# Patient Record
Sex: Female | Born: 1960
Health system: Southern US, Community
[De-identification: ages and names within clinical notes are randomized; demographics above are authoritative.]

## PROBLEM LIST (undated history)

## (undated) DIAGNOSIS — E039 Hypothyroidism, unspecified: Secondary | ICD-10-CM

## (undated) DIAGNOSIS — F329 Major depressive disorder, single episode, unspecified: Secondary | ICD-10-CM

## (undated) DIAGNOSIS — E079 Disorder of thyroid, unspecified: Secondary | ICD-10-CM

## (undated) DIAGNOSIS — M0230 Reiter's disease, unspecified site: Secondary | ICD-10-CM

## (undated) DIAGNOSIS — E559 Vitamin D deficiency, unspecified: Secondary | ICD-10-CM

## (undated) DIAGNOSIS — F32A Depression, unspecified: Secondary | ICD-10-CM

## (undated) DIAGNOSIS — N809 Endometriosis, unspecified: Secondary | ICD-10-CM

## (undated) HISTORY — DX: Endometriosis, unspecified: N80.9

## (undated) HISTORY — DX: Depression, unspecified: F32.A

## (undated) HISTORY — PX: TONSILLECTOMY: SUR1361

## (undated) HISTORY — DX: Vitamin D deficiency, unspecified: E55.9

## (undated) HISTORY — DX: Disorder of thyroid, unspecified: E07.9

## (undated) HISTORY — PX: CHOLECYSTECTOMY: SHX55

## (undated) HISTORY — DX: Major depressive disorder, single episode, unspecified: F32.9

---

## 1997-07-27 ENCOUNTER — Ambulatory Visit (HOSPITAL_COMMUNITY): Admission: RE | Admit: 1997-07-27 | Discharge: 1997-07-27 | Payer: Self-pay | Admitting: Obstetrics & Gynecology

## 1997-12-09 ENCOUNTER — Ambulatory Visit (HOSPITAL_COMMUNITY): Admission: RE | Admit: 1997-12-09 | Discharge: 1997-12-09 | Payer: Self-pay | Admitting: Gastroenterology

## 2000-09-15 ENCOUNTER — Other Ambulatory Visit: Admission: RE | Admit: 2000-09-15 | Discharge: 2000-09-15 | Payer: Self-pay | Admitting: Obstetrics & Gynecology

## 2001-11-04 ENCOUNTER — Other Ambulatory Visit: Admission: RE | Admit: 2001-11-04 | Discharge: 2001-11-04 | Payer: Self-pay | Admitting: Obstetrics & Gynecology

## 2003-02-10 ENCOUNTER — Other Ambulatory Visit: Admission: RE | Admit: 2003-02-10 | Discharge: 2003-02-10 | Payer: Self-pay | Admitting: Obstetrics & Gynecology

## 2003-06-21 ENCOUNTER — Ambulatory Visit (HOSPITAL_COMMUNITY): Admission: RE | Admit: 2003-06-21 | Discharge: 2003-06-21 | Payer: Self-pay | Admitting: Gastroenterology

## 2003-10-28 ENCOUNTER — Emergency Department (HOSPITAL_COMMUNITY): Admission: EM | Admit: 2003-10-28 | Discharge: 2003-10-29 | Payer: Self-pay | Admitting: Emergency Medicine

## 2004-10-08 ENCOUNTER — Other Ambulatory Visit: Admission: RE | Admit: 2004-10-08 | Discharge: 2004-10-08 | Payer: Self-pay | Admitting: Obstetrics & Gynecology

## 2010-08-11 ENCOUNTER — Emergency Department (INDEPENDENT_AMBULATORY_CARE_PROVIDER_SITE_OTHER): Payer: 59

## 2010-08-11 ENCOUNTER — Emergency Department (HOSPITAL_BASED_OUTPATIENT_CLINIC_OR_DEPARTMENT_OTHER)
Admission: EM | Admit: 2010-08-11 | Discharge: 2010-08-11 | Disposition: A | Discharge status: Home / Self Care | Payer: 59 | Attending: Emergency Medicine | Admitting: Emergency Medicine

## 2010-08-11 DIAGNOSIS — Z79899 Other long term (current) drug therapy: Secondary | ICD-10-CM | POA: Insufficient documentation

## 2010-08-11 DIAGNOSIS — S92919A Unspecified fracture of unspecified toe(s), initial encounter for closed fracture: Secondary | ICD-10-CM

## 2010-08-11 DIAGNOSIS — W2209XA Striking against other stationary object, initial encounter: Secondary | ICD-10-CM

## 2010-08-11 DIAGNOSIS — Z8739 Personal history of other diseases of the musculoskeletal system and connective tissue: Secondary | ICD-10-CM | POA: Insufficient documentation

## 2013-08-05 ENCOUNTER — Other Ambulatory Visit: Payer: Self-pay | Admitting: Family Medicine

## 2013-08-05 DIAGNOSIS — R519 Headache, unspecified: Secondary | ICD-10-CM

## 2013-08-05 DIAGNOSIS — R51 Headache: Principal | ICD-10-CM

## 2013-08-13 ENCOUNTER — Ambulatory Visit
Admission: RE | Admit: 2013-08-13 | Discharge: 2013-08-13 | Disposition: A | Discharge status: Home / Self Care | Payer: 59 | Source: Ambulatory Visit | Attending: Family Medicine | Admitting: Family Medicine

## 2013-08-13 DIAGNOSIS — R519 Headache, unspecified: Secondary | ICD-10-CM

## 2013-08-13 DIAGNOSIS — R51 Headache: Principal | ICD-10-CM

## 2014-11-10 ENCOUNTER — Other Ambulatory Visit: Payer: Self-pay | Admitting: Obstetrics & Gynecology

## 2014-11-10 DIAGNOSIS — R928 Other abnormal and inconclusive findings on diagnostic imaging of breast: Secondary | ICD-10-CM

## 2014-11-15 ENCOUNTER — Ambulatory Visit
Admission: RE | Admit: 2014-11-15 | Discharge: 2014-11-15 | Disposition: A | Discharge status: Home / Self Care | Payer: 59 | Source: Ambulatory Visit | Attending: Obstetrics & Gynecology | Admitting: Obstetrics & Gynecology

## 2014-11-15 DIAGNOSIS — R928 Other abnormal and inconclusive findings on diagnostic imaging of breast: Secondary | ICD-10-CM

## 2016-01-19 ENCOUNTER — Other Ambulatory Visit: Payer: Self-pay | Admitting: Obstetrics & Gynecology

## 2016-01-19 DIAGNOSIS — R928 Other abnormal and inconclusive findings on diagnostic imaging of breast: Secondary | ICD-10-CM

## 2016-01-26 ENCOUNTER — Other Ambulatory Visit: Payer: 59

## 2016-01-30 ENCOUNTER — Ambulatory Visit
Admission: RE | Admit: 2016-01-30 | Discharge: 2016-01-30 | Disposition: A | Discharge status: Home / Self Care | Payer: 59 | Source: Ambulatory Visit | Attending: Obstetrics & Gynecology | Admitting: Obstetrics & Gynecology

## 2016-01-30 DIAGNOSIS — R928 Other abnormal and inconclusive findings on diagnostic imaging of breast: Secondary | ICD-10-CM

## 2016-04-02 DIAGNOSIS — M7062 Trochanteric bursitis, left hip: Secondary | ICD-10-CM | POA: Diagnosis not present

## 2016-04-02 DIAGNOSIS — M5432 Sciatica, left side: Secondary | ICD-10-CM | POA: Diagnosis not present

## 2016-04-03 DIAGNOSIS — M5432 Sciatica, left side: Secondary | ICD-10-CM | POA: Diagnosis not present

## 2016-04-09 DIAGNOSIS — M5432 Sciatica, left side: Secondary | ICD-10-CM | POA: Diagnosis not present

## 2016-04-24 DIAGNOSIS — M5432 Sciatica, left side: Secondary | ICD-10-CM | POA: Diagnosis not present

## 2016-05-04 DIAGNOSIS — R319 Hematuria, unspecified: Secondary | ICD-10-CM | POA: Diagnosis not present

## 2016-05-04 DIAGNOSIS — N390 Urinary tract infection, site not specified: Secondary | ICD-10-CM | POA: Diagnosis not present

## 2016-05-04 DIAGNOSIS — R300 Dysuria: Secondary | ICD-10-CM | POA: Diagnosis not present

## 2016-05-13 DIAGNOSIS — N390 Urinary tract infection, site not specified: Secondary | ICD-10-CM | POA: Diagnosis not present

## 2016-05-13 DIAGNOSIS — R1310 Dysphagia, unspecified: Secondary | ICD-10-CM | POA: Diagnosis not present

## 2016-05-13 DIAGNOSIS — M5432 Sciatica, left side: Secondary | ICD-10-CM | POA: Diagnosis not present

## 2016-05-27 ENCOUNTER — Other Ambulatory Visit: Payer: Self-pay | Admitting: Gastroenterology

## 2016-05-27 DIAGNOSIS — R1011 Right upper quadrant pain: Secondary | ICD-10-CM | POA: Diagnosis not present

## 2016-05-27 DIAGNOSIS — R1013 Epigastric pain: Secondary | ICD-10-CM

## 2016-06-05 ENCOUNTER — Ambulatory Visit
Admission: RE | Admit: 2016-06-05 | Discharge: 2016-06-05 | Disposition: A | Discharge status: Home / Self Care | Payer: 59 | Source: Ambulatory Visit | Attending: Gastroenterology | Admitting: Gastroenterology

## 2016-06-05 DIAGNOSIS — R1011 Right upper quadrant pain: Secondary | ICD-10-CM

## 2016-06-05 DIAGNOSIS — R109 Unspecified abdominal pain: Secondary | ICD-10-CM | POA: Diagnosis not present

## 2016-06-05 DIAGNOSIS — R1013 Epigastric pain: Secondary | ICD-10-CM

## 2016-06-05 DIAGNOSIS — K449 Diaphragmatic hernia without obstruction or gangrene: Secondary | ICD-10-CM | POA: Diagnosis not present

## 2016-06-26 ENCOUNTER — Other Ambulatory Visit: Payer: Self-pay | Admitting: Gastroenterology

## 2016-06-26 DIAGNOSIS — R1314 Dysphagia, pharyngoesophageal phase: Secondary | ICD-10-CM | POA: Diagnosis not present

## 2016-06-26 DIAGNOSIS — R1011 Right upper quadrant pain: Secondary | ICD-10-CM | POA: Diagnosis not present

## 2016-07-03 ENCOUNTER — Ambulatory Visit
Admission: RE | Admit: 2016-07-03 | Discharge: 2016-07-03 | Disposition: A | Discharge status: Home / Self Care | Payer: 59 | Source: Ambulatory Visit | Attending: Gastroenterology | Admitting: Gastroenterology

## 2016-07-03 DIAGNOSIS — R1011 Right upper quadrant pain: Secondary | ICD-10-CM

## 2016-07-03 MED ORDER — IOPAMIDOL (ISOVUE-300) INJECTION 61%
100.0000 mL | Freq: Once | INTRAVENOUS | Status: AC | PRN
  Administered 2016-07-03: 100 mL via INTRAVENOUS

## 2016-07-22 DIAGNOSIS — R1310 Dysphagia, unspecified: Secondary | ICD-10-CM | POA: Diagnosis not present

## 2016-07-22 DIAGNOSIS — R1013 Epigastric pain: Secondary | ICD-10-CM | POA: Diagnosis not present

## 2016-07-24 DIAGNOSIS — H209 Unspecified iridocyclitis: Secondary | ICD-10-CM | POA: Diagnosis not present

## 2016-07-24 DIAGNOSIS — M0230 Reiter's disease, unspecified site: Secondary | ICD-10-CM | POA: Diagnosis not present

## 2016-07-24 DIAGNOSIS — M4690 Unspecified inflammatory spondylopathy, site unspecified: Secondary | ICD-10-CM | POA: Diagnosis not present

## 2016-08-12 DIAGNOSIS — E559 Vitamin D deficiency, unspecified: Secondary | ICD-10-CM | POA: Diagnosis not present

## 2016-08-12 DIAGNOSIS — E039 Hypothyroidism, unspecified: Secondary | ICD-10-CM | POA: Diagnosis not present

## 2016-08-12 DIAGNOSIS — Z0000 Encounter for general adult medical examination without abnormal findings: Secondary | ICD-10-CM | POA: Diagnosis not present

## 2017-02-09 DIAGNOSIS — Z23 Encounter for immunization: Secondary | ICD-10-CM | POA: Diagnosis not present

## 2017-03-18 DIAGNOSIS — J029 Acute pharyngitis, unspecified: Secondary | ICD-10-CM | POA: Diagnosis not present

## 2017-03-22 DIAGNOSIS — H1032 Unspecified acute conjunctivitis, left eye: Secondary | ICD-10-CM | POA: Diagnosis not present

## 2017-03-31 ENCOUNTER — Other Ambulatory Visit: Payer: Self-pay

## 2017-03-31 ENCOUNTER — Encounter (HOSPITAL_BASED_OUTPATIENT_CLINIC_OR_DEPARTMENT_OTHER): Payer: Self-pay | Admitting: *Deleted

## 2017-03-31 ENCOUNTER — Emergency Department (HOSPITAL_BASED_OUTPATIENT_CLINIC_OR_DEPARTMENT_OTHER)
Admission: EM | Admit: 2017-03-31 | Discharge: 2017-03-31 | Disposition: A | Discharge status: Home / Self Care | Payer: 59 | Attending: Physician Assistant | Admitting: Physician Assistant

## 2017-03-31 DIAGNOSIS — Z79899 Other long term (current) drug therapy: Secondary | ICD-10-CM | POA: Insufficient documentation

## 2017-03-31 DIAGNOSIS — Z7982 Long term (current) use of aspirin: Secondary | ICD-10-CM | POA: Diagnosis not present

## 2017-03-31 DIAGNOSIS — M0230 Reiter's disease, unspecified site: Secondary | ICD-10-CM | POA: Diagnosis not present

## 2017-03-31 DIAGNOSIS — M25561 Pain in right knee: Secondary | ICD-10-CM | POA: Diagnosis not present

## 2017-03-31 DIAGNOSIS — M5442 Lumbago with sciatica, left side: Secondary | ICD-10-CM

## 2017-03-31 HISTORY — DX: Reiter's disease, unspecified site: M02.30

## 2017-03-31 LAB — URINALYSIS, ROUTINE W REFLEX MICROSCOPIC
Bilirubin Urine: NEGATIVE
Glucose, UA: NEGATIVE mg/dL
Ketones, ur: NEGATIVE mg/dL
LEUKOCYTES UA: NEGATIVE
Nitrite: NEGATIVE
PROTEIN: NEGATIVE mg/dL
Specific Gravity, Urine: >1.03 — ABNORMAL HIGH (ref 1.005–1.030)
pH: 6 (ref 5.0–8.0)

## 2017-03-31 LAB — URINALYSIS, MICROSCOPIC (REFLEX)

## 2017-03-31 MED ORDER — IBUPROFEN 600 MG PO TABS: 600.0000 mg | ORAL_TABLET | Freq: Four times a day (QID) | ORAL | 0 refills | Status: DC | PRN

## 2017-03-31 MED ORDER — LIDOCAINE 5 % EX PTCH: 1.0000 | MEDICATED_PATCH | CUTANEOUS | 0 refills | Status: DC

## 2017-03-31 MED ORDER — HYDROCODONE-ACETAMINOPHEN 5-325 MG PO TABS
1.0000 | ORAL_TABLET | ORAL | 0 refills | Status: DC | PRN
Start: 2017-03-31 — End: 2019-08-29

## 2017-03-31 MED ORDER — METHOCARBAMOL 500 MG PO TABS: 500.0000 mg | ORAL_TABLET | Freq: Two times a day (BID) | ORAL | 0 refills | Status: DC

## 2017-03-31 MED ORDER — PREDNISONE 10 MG (21) PO TBPK: ORAL_TABLET | ORAL | 0 refills | Status: DC

## 2017-03-31 NOTE — ED Provider Notes (Signed)
[facility] EMERGENCY DEPARTMENT Provider Note   CSN: [id number] Arrival date & time: 03/31/17  1601     History   Chief Complaint No chief complaint on file.   HPI Michelle Fletcher is a 56 y.o. female.  HPI   Michelle Fletcher is a 56 y.o. female, with a history of Reiter syndrome and thyroid disease, presenting to the ED with what she believes is a flare of her Reiter's syndrome. States she typically has a triad of conjunctivitis, singular joint pain, and hematuria with her Reiter's syndrome flares.  States is typically treated with a 6-day prednisone taper. Complains of right knee pain, redness, and swelling over the past couple days.    Left eye redness, irritation, and minor discharge last week, subsided, then progressed to the right eye.  The right eye symptoms have also subsided prior to her arrival today.  Patient is followed by rheumatologist, however, she states she has been unable to get in to see her due to the holiday. She adds that the above symptoms were preceded by upper respiratory congestion and cough, both of which have subsided.  Patient also complains of left lower back pain with muscle spasms for the last 3 or 4 days.  States she has had this problem in the past.  Pain radiates down the back of the left leg.  Denies numbness/tingling, weakness, falls/trauma, abdominal pain, urinary complaints, gross hematuria, fever/chills, N/V/D currently, or any other complaints.  Denies history of PE/DVT, recent immobilization, surgery, trauma, procedures to the right knee, history of septic joint, or history of gonorrhea infection.   Past Medical History:  Diagnosis Date  . Depression   . Endometriosis   . Reiter's syndrome (HCC)   . Thyroid disease   . Vitamin D deficiency     There are no active problems to display for this patient.   Past Surgical History:  Procedure Laterality Date  . CESAREAN SECTION     x2  . CHOLECYSTECTOMY    . TONSILLECTOMY       OB History    No data available       Home Medications    Prior to Admission medications   Medication Sig Start Date End Date Taking? Authorizing Provider  aspirin 81 MG tablet Take 81 mg by mouth daily.   Yes [provider]  buPROPion (WELLBUTRIN SR) 150 MG 12 hr tablet Take 150 mg by mouth 3 (three) times daily.   Yes [provider]  drospirenone-ethinyl estradiol (YAZ,GIANVI,LORYNA) 3-0.02 MG tablet Take 1 tablet by mouth daily.   Yes [provider]  Levothyroxine Sodium (SYNTHROID PO) Take by mouth.   Yes [provider]  HYDROcodone-acetaminophen (NORCO/VICODIN) 5-325 MG tablet Take 1-2 tablets by mouth every 4 (four) hours as needed for severe pain. 03/31/17   Joy, Shawn C, PA-C  ibuprofen (ADVIL,MOTRIN) 600 MG tablet Take 1 tablet (600 mg total) by mouth every 6 (six) hours as needed. 03/31/17   Joy, Shawn C, PA-C  lidocaine (LIDODERM) 5 % Place 1 patch onto the skin daily. Remove & Discard patch within 12 hours or as directed by MD 03/31/17   Joy, Shawn C, PA-C  methocarbamol (ROBAXIN) 500 MG tablet Take 1 tablet (500 mg total) by mouth 2 (two) times daily. 03/31/17   Joy, Shawn C, PA-C  predniSONE (STERAPRED UNI-PAK 21 TAB) 10 MG (21) TBPK tablet Take 6 tabs (60mg ) on day 1, 5 tabs (50mg ) on day 2, 4 tabs (40mg ) on day 3,  3 tabs (30mg ) on day 4, 2 tabs (20mg ) on day 5, and 1 tab (10mg ) on day 6. 03/31/17   Joy, Hillard DankerShawn C, PA-C    Family History Family History  Problem Relation Age of Onset  . Hypertension Mother   . Hypertension Father   . Hypertension Brother     Social History Social History   Tobacco Use  . Smoking status: Never Smoker  . Smokeless tobacco: Never Used  Substance Use Topics  . Alcohol use: No    Frequency: Never  . Drug use: No     Allergies   Sulfa antibiotics   Review of Systems Review of Systems  Constitutional: Negative for chills, diaphoresis and fever.  HENT: Negative for trouble swallowing.    Respiratory: Negative for cough and shortness of breath.   Cardiovascular: Negative for chest pain.  Gastrointestinal: Negative for abdominal pain, diarrhea, nausea and vomiting.  Musculoskeletal: Positive for arthralgias, back pain and joint swelling.  Neurological: Negative for dizziness, weakness, light-headedness and numbness.  All other systems reviewed and are negative.    Physical Exam Updated Vital Signs BP 126/63 (BP Location: Right Arm)   Pulse 84   Temp 98.3 F (36.8 C) (Oral)   Resp 16   Ht 5' 5.5" (1.664 m)   Wt 72.6 kg (160 lb)   SpO2 100%   BMI 26.22 kg/m   Physical Exam  Constitutional: She appears well-developed and well-nourished. No distress.  HENT:  Head: Normocephalic and atraumatic.  Right Ear: Tympanic membrane, external ear and ear canal normal.  Left Ear: Tympanic membrane, external ear and ear canal normal.  Nose: Nose normal.  Mouth/Throat: Uvula is midline and oropharynx is clear and moist.  Eyes: Conjunctivae are normal.  Neck: Neck supple.  Cardiovascular: Normal rate, regular rhythm, normal heart sounds and intact distal pulses.  Pulmonary/Chest: Effort normal and breath sounds normal. No respiratory distress.  Abdominal: Soft. There is no tenderness. There is no guarding.  Musculoskeletal: She exhibits edema and tenderness. She exhibits no deformity.  Some tenderness to the anterior right knee accompanied by minor swelling.  No noted erythema.  Full range of motion in the right knee, although painful past 90 degrees flexion. Tenderness to the left lower lumbar musculature inferior to the superior border of the iliac crest, extending into the left buttock.  Lymphadenopathy:    She has no cervical adenopathy.  Neurological: She is alert.  No noted acute sensory deficits. Strength 5/5 with flexion and extension at the bilateral hips, knees, and ankles. Ambulatory without assistance.  Coordination intact.  Skin: Skin is warm and dry. She is  not diaphoretic.  Psychiatric: She has a normal mood and affect. Her behavior is normal.  Nursing note and vitals reviewed.    ED Treatments / Results  Labs (all labs ordered are listed, but only abnormal results are displayed) Labs Reviewed  URINALYSIS, ROUTINE W REFLEX MICROSCOPIC - Abnormal; Notable for the following components:      Result Value   Specific Gravity, Urine >1.030 (*)    Hgb urine dipstick SMALL (*)    All other components within normal limits  URINALYSIS, MICROSCOPIC (REFLEX) - Abnormal; Notable for the following components:   Bacteria, UA RARE (*)    Squamous Epithelial / LPF 0-5 (*)    All other components within normal limits    EKG  EKG Interpretation None       Radiology No results found.  Procedures Procedures (including critical care time)  Medications Ordered in  ED Medications - No data to display   Initial Impression / Assessment and Plan / ED Course  I have reviewed the triage vital signs and the nursing notes.  Pertinent labs & imaging results that were available during my care of the patient were reviewed by me and considered in my medical decision making (see chart for details).     Patient has a set of symptoms that she states are consistent with a flare of her Reiter syndrome.  I find no red flag symptoms on exam that would make me think otherwise. Right knee is somewhat swollen and tender to the anterior portion.  No noted redness.  Patient allows range of motion of the joint.  Doubt septic joint. Patient will continue with use of NSAIDs and we will add a prednisone taper, which patient states has worked for her in the past.  She will follow-up with her PCP regarding her back pain and her rheumatologist regarding her other symptoms. The patient was given instructions for home care as well as return precautions. Patient voices understanding of these instructions, accepts the plan, and is comfortable with discharge.    Final  Clinical Impressions(s) / ED Diagnoses   Final diagnoses:  Acute pain of right knee  Acute left-sided low back pain with left-sided sciatica    ED Discharge Orders        Ordered    ibuprofen (ADVIL,MOTRIN) 600 MG tablet  Every 6 hours PRN     03/31/17 2103    HYDROcodone-acetaminophen (NORCO/VICODIN) 5-325 MG tablet  Every 4 hours PRN     03/31/17 2103    methocarbamol (ROBAXIN) 500 MG tablet  2 times daily     03/31/17 2103    predniSONE (STERAPRED UNI-PAK 21 TAB) 10 MG (21) TBPK tablet     03/31/17 2103    lidocaine (LIDODERM) 5 %  Every 24 hours     03/31/17 2103       Anselm PancoastJoy, Shawn C, PA-C 04/01/17 1500    Abelino DerrickMackuen, Courteney Lyn, MD 04/01/17 [phone]

## 2017-03-31 NOTE — Discharge Instructions (Signed)
Take it easy, but do not lay around too much as this may make any stiffness worse.  Antiinflammatory medications: Take 600 mg of ibuprofen every 6 hours or 440 mg (over the counter dose) to 500 mg (prescription dose) of naproxen every 12 hours for the next 3 days. After this time, these medications may be used as needed for pain. Take these medications with food to avoid upset stomach. Choose only one of these medications, do not take them together.  Tylenol: Should you continue to have additional pain while taking the ibuprofen or naproxen, you may add in tylenol as needed. Your daily total maximum amount of tylenol from all sources should be limited to 4000mg /day for persons without liver problems, or 2000mg /day for those with liver problems. Muscle relaxer: Robaxin is a muscle relaxer and may help loosen stiff muscles. Do not take the Robaxin while driving or performing other dangerous activities.  Vicodin: May take Vicodin as needed for severe pain.  Do not drive or perform other dangerous activities while taking the Vicodin.  Please note that each pill of Vicodin contains 325 mg of Tylenol and the above dosage limits apply. Prednisone: Take the prednisone until finished. Lidocaine patches: These are available via either prescription or over-the-counter. The over-the-counter option may be more economical one and are likely just as effective. There are multiple over-the-counter brands, such as Salonpas. Exercises: Be sure to perform the attached exercises starting with three times a week and working up to performing them daily. This is an essential part of preventing long term problems.   Follow up with a primary care provider for any future management of these complaints. Return to the ED should symptoms worsen or you develop a fever over 100.3 F, vomiting, abdominal pain, numbness, weakness, or any other major concerns.

## 2017-03-31 NOTE — ED Triage Notes (Signed)
States she has been sick with a virus for weeks. Her MD has been seeing her for same. States now she has possible pink eye. Pain in her back and knees.

## 2017-04-15 DIAGNOSIS — M4690 Unspecified inflammatory spondylopathy, site unspecified: Secondary | ICD-10-CM | POA: Diagnosis not present

## 2017-04-15 DIAGNOSIS — M25561 Pain in right knee: Secondary | ICD-10-CM | POA: Diagnosis not present

## 2017-04-15 DIAGNOSIS — H209 Unspecified iridocyclitis: Secondary | ICD-10-CM | POA: Diagnosis not present

## 2017-05-30 DIAGNOSIS — M25561 Pain in right knee: Secondary | ICD-10-CM | POA: Diagnosis not present

## 2017-05-30 DIAGNOSIS — H209 Unspecified iridocyclitis: Secondary | ICD-10-CM | POA: Diagnosis not present

## 2017-05-30 DIAGNOSIS — M4690 Unspecified inflammatory spondylopathy, site unspecified: Secondary | ICD-10-CM | POA: Diagnosis not present

## 2017-06-06 DIAGNOSIS — M545 Low back pain: Secondary | ICD-10-CM | POA: Diagnosis not present

## 2017-06-06 DIAGNOSIS — M5417 Radiculopathy, lumbosacral region: Secondary | ICD-10-CM | POA: Diagnosis not present

## 2017-06-06 DIAGNOSIS — M25551 Pain in right hip: Secondary | ICD-10-CM | POA: Diagnosis not present

## 2017-06-16 DIAGNOSIS — M545 Low back pain: Secondary | ICD-10-CM | POA: Diagnosis not present

## 2017-06-16 DIAGNOSIS — M25551 Pain in right hip: Secondary | ICD-10-CM | POA: Diagnosis not present

## 2017-06-24 DIAGNOSIS — M545 Low back pain: Secondary | ICD-10-CM | POA: Diagnosis not present

## 2017-06-24 DIAGNOSIS — M419 Scoliosis, unspecified: Secondary | ICD-10-CM | POA: Diagnosis not present

## 2017-06-24 DIAGNOSIS — M5417 Radiculopathy, lumbosacral region: Secondary | ICD-10-CM | POA: Diagnosis not present

## 2017-08-21 DIAGNOSIS — E039 Hypothyroidism, unspecified: Secondary | ICD-10-CM | POA: Diagnosis not present

## 2017-08-21 DIAGNOSIS — Z0000 Encounter for general adult medical examination without abnormal findings: Secondary | ICD-10-CM | POA: Diagnosis not present

## 2017-08-21 DIAGNOSIS — E559 Vitamin D deficiency, unspecified: Secondary | ICD-10-CM | POA: Diagnosis not present

## 2017-08-22 DIAGNOSIS — E559 Vitamin D deficiency, unspecified: Secondary | ICD-10-CM | POA: Diagnosis not present

## 2017-08-22 DIAGNOSIS — E039 Hypothyroidism, unspecified: Secondary | ICD-10-CM | POA: Diagnosis not present

## 2017-09-25 DIAGNOSIS — R109 Unspecified abdominal pain: Secondary | ICD-10-CM | POA: Diagnosis not present

## 2017-09-25 DIAGNOSIS — Z800 Family history of malignant neoplasm of digestive organs: Secondary | ICD-10-CM | POA: Diagnosis not present

## 2017-10-25 DIAGNOSIS — M79642 Pain in left hand: Secondary | ICD-10-CM | POA: Diagnosis not present

## 2017-10-25 DIAGNOSIS — S0512XA Contusion of eyeball and orbital tissues, left eye, initial encounter: Secondary | ICD-10-CM | POA: Diagnosis not present

## 2017-11-04 DIAGNOSIS — M25562 Pain in left knee: Secondary | ICD-10-CM | POA: Diagnosis not present

## 2017-11-04 DIAGNOSIS — M25532 Pain in left wrist: Secondary | ICD-10-CM | POA: Diagnosis not present

## 2017-11-13 DIAGNOSIS — R51 Headache: Secondary | ICD-10-CM | POA: Diagnosis not present

## 2017-11-13 DIAGNOSIS — M546 Pain in thoracic spine: Secondary | ICD-10-CM | POA: Diagnosis not present

## 2017-11-15 DIAGNOSIS — R51 Headache: Secondary | ICD-10-CM | POA: Diagnosis not present

## 2017-11-17 DIAGNOSIS — R51 Headache: Secondary | ICD-10-CM | POA: Diagnosis not present

## 2017-11-20 DIAGNOSIS — M5134 Other intervertebral disc degeneration, thoracic region: Secondary | ICD-10-CM | POA: Diagnosis not present

## 2017-11-20 DIAGNOSIS — M546 Pain in thoracic spine: Secondary | ICD-10-CM | POA: Diagnosis not present

## 2017-11-20 DIAGNOSIS — M545 Low back pain: Secondary | ICD-10-CM | POA: Diagnosis not present

## 2017-12-02 DIAGNOSIS — M25551 Pain in right hip: Secondary | ICD-10-CM | POA: Diagnosis not present

## 2017-12-05 DIAGNOSIS — M546 Pain in thoracic spine: Secondary | ICD-10-CM | POA: Diagnosis not present

## 2017-12-08 DIAGNOSIS — M545 Low back pain: Secondary | ICD-10-CM | POA: Diagnosis not present

## 2017-12-08 DIAGNOSIS — H209 Unspecified iridocyclitis: Secondary | ICD-10-CM | POA: Diagnosis not present

## 2017-12-08 DIAGNOSIS — M4690 Unspecified inflammatory spondylopathy, site unspecified: Secondary | ICD-10-CM | POA: Diagnosis not present

## 2017-12-15 DIAGNOSIS — M546 Pain in thoracic spine: Secondary | ICD-10-CM | POA: Diagnosis not present

## 2017-12-24 DIAGNOSIS — M545 Low back pain: Secondary | ICD-10-CM | POA: Diagnosis not present

## 2018-01-06 DIAGNOSIS — Z6827 Body mass index (BMI) 27.0-27.9, adult: Secondary | ICD-10-CM | POA: Diagnosis not present

## 2018-01-06 DIAGNOSIS — Z01419 Encounter for gynecological examination (general) (routine) without abnormal findings: Secondary | ICD-10-CM | POA: Diagnosis not present

## 2018-01-07 DIAGNOSIS — M5417 Radiculopathy, lumbosacral region: Secondary | ICD-10-CM | POA: Diagnosis not present

## 2018-02-07 ENCOUNTER — Encounter (HOSPITAL_COMMUNITY): Payer: Self-pay

## 2018-02-07 ENCOUNTER — Emergency Department (HOSPITAL_COMMUNITY): Payer: 59

## 2018-02-07 ENCOUNTER — Emergency Department (HOSPITAL_COMMUNITY)
Admission: EM | Admit: 2018-02-07 | Discharge: 2018-02-07 | Disposition: A | Discharge status: Home / Self Care | Payer: 59 | Attending: Emergency Medicine | Admitting: Emergency Medicine

## 2018-02-07 DIAGNOSIS — Z7982 Long term (current) use of aspirin: Secondary | ICD-10-CM | POA: Diagnosis not present

## 2018-02-07 DIAGNOSIS — Z79899 Other long term (current) drug therapy: Secondary | ICD-10-CM | POA: Diagnosis not present

## 2018-02-07 DIAGNOSIS — R0789 Other chest pain: Secondary | ICD-10-CM

## 2018-02-07 DIAGNOSIS — E079 Disorder of thyroid, unspecified: Secondary | ICD-10-CM | POA: Diagnosis not present

## 2018-02-07 LAB — CBC WITH DIFFERENTIAL/PLATELET
Abs Immature Granulocytes: 0.01 10*3/uL (ref 0.00–0.07)
BASOS ABS: 0 10*3/uL (ref 0.0–0.1)
Basophils Relative: 0 %
EOS PCT: 2 %
Eosinophils Absolute: 0.1 10*3/uL (ref 0.0–0.5)
HCT: 44.2 % (ref 36.0–46.0)
HEMOGLOBIN: 14.7 g/dL (ref 12.0–15.0)
Immature Granulocytes: 0 %
LYMPHS PCT: 37 %
Lymphs Abs: 2.3 10*3/uL (ref 0.7–4.0)
MCH: 28.8 pg (ref 26.0–34.0)
MCHC: 33.3 g/dL (ref 30.0–36.0)
MCV: 86.7 fL (ref 80.0–100.0)
MONO ABS: 0.7 10*3/uL (ref 0.1–1.0)
MONOS PCT: 10 %
NRBC: 0 % (ref 0.0–0.2)
Neutro Abs: 3.3 10*3/uL (ref 1.7–7.7)
Neutrophils Relative %: 51 %
Platelets: 268 10*3/uL (ref 150–400)
RBC: 5.1 MIL/uL (ref 3.87–5.11)
RDW: 11.6 % (ref 11.5–15.5)
WBC: 6.4 10*3/uL (ref 4.0–10.5)

## 2018-02-07 LAB — I-STAT CHEM 8, ED
BUN: 15 mg/dL (ref 6–20)
CALCIUM ION: 1.18 mmol/L (ref 1.15–1.40)
CREATININE: 0.7 mg/dL (ref 0.44–1.00)
Chloride: 103 mmol/L (ref 98–111)
GLUCOSE: 90 mg/dL (ref 70–99)
HCT: 43 % (ref 36.0–46.0)
HEMOGLOBIN: 14.6 g/dL (ref 12.0–15.0)
Potassium: 4 mmol/L (ref 3.5–5.1)
Sodium: 139 mmol/L (ref 135–145)
TCO2: 25 mmol/L (ref 22–32)

## 2018-02-07 LAB — I-STAT TROPONIN, ED
TROPONIN I, POC: 0 ng/mL (ref 0.00–0.08)
Troponin i, poc: 0 ng/mL (ref 0.00–0.08)

## 2018-02-07 NOTE — ED Triage Notes (Signed)
Pt here for eval of chest discomfort with palpitations and nausea, pt reports her EKG at the Drs. Office was abnormal.

## 2018-02-07 NOTE — ED Notes (Signed)
IV removed from RAC; catheter intact, IV site clean, dry, intact

## 2018-02-07 NOTE — ED Provider Notes (Signed)
[facility] EMERGENCY DEPARTMENT Provider Note   CSN: [id number] Arrival date & time: 02/07/18  1233     History   Chief Complaint Chief Complaint  Patient presents with  . Chest Pain  . Abnormal ECG    HPI Michelle Fletcher is a 57 y.o. female.  Patient awakened 9 AM this morning with frontal headache and vague anterior chest discomfort which she describes as a "fluttering" though she does not feel her heart beating.  Symptoms accompanied by nausea no sweatiness or shortness of breath.  She went to her [facility] walk-in clinic and EKG was performed and she was sent here for further evaluation.  She reports that symptoms are nonexertional.  She exercises several times per week in the gym on a recumbent bike and lifts weights without any chest discomfort.  Prior to coming here.  HPI  Past Medical History:  Diagnosis Date  . Depression   . Endometriosis   . Reiter's syndrome (HCC)   . Thyroid disease   . Vitamin D deficiency     There are no active problems to display for this patient.   Past Surgical History:  Procedure Laterality Date  . CESAREAN SECTION     x2  . CHOLECYSTECTOMY    . TONSILLECTOMY       OB History   None      Home Medications    Prior to Admission medications   Medication Sig Start Date End Date Taking? Authorizing Provider  aspirin 81 MG tablet Take 81 mg by mouth daily.    [provider]  buPROPion (WELLBUTRIN SR) 150 MG 12 hr tablet Take 150 mg by mouth 3 (three) times daily.    [provider]  drospirenone-ethinyl estradiol (YAZ,GIANVI,LORYNA) 3-0.02 MG tablet Take 1 tablet by mouth daily.    [provider]  HYDROcodone-acetaminophen (NORCO/VICODIN) 5-325 MG tablet Take 1-2 tablets by mouth every 4 (four) hours as needed for severe pain. 03/31/17   Joy, Shawn C, PA-C  ibuprofen (ADVIL,MOTRIN) 600 MG tablet Take 1 tablet (600 mg total) by mouth every 6 (six) hours as needed. 03/31/17   Joy, Shawn  C, PA-C  Levothyroxine Sodium (SYNTHROID PO) Take by mouth.    [provider]  lidocaine (LIDODERM) 5 % Place 1 patch onto the skin daily. Remove & Discard patch within 12 hours or as directed by MD 03/31/17   Joy, Shawn C, PA-C  methocarbamol (ROBAXIN) 500 MG tablet Take 1 tablet (500 mg total) by mouth 2 (two) times daily. 03/31/17   Joy, Shawn C, PA-C  predniSONE (STERAPRED UNI-PAK 21 TAB) 10 MG (21) TBPK tablet Take 6 tabs (60mg ) on day 1, 5 tabs (50mg ) on day 2, 4 tabs (40mg ) on day 3, 3 tabs (30mg ) on day 4, 2 tabs (20mg ) on day 5, and 1 tab (10mg ) on day 6. 03/31/17   Joy, Hillard Danker, PA-C    Family History Family History  Problem Relation Age of Onset  . Hypertension Mother   . Hypertension Father   . Hypertension Brother    Family history negative for cardiac disease. Social History Social History   Tobacco Use  . Smoking status: Never Smoker  . Smokeless tobacco: Never Used  Substance Use Topics  . Alcohol use: No    Frequency: Never  . Drug use: No     Allergies   Sulfa antibiotics   Review of Systems Review of Systems  Constitutional: Negative.   HENT: Negative.   Respiratory: Negative.  Cardiovascular: Positive for chest pain.       Vague chest discomfort.  No palpitations  Gastrointestinal: Positive for abdominal pain and nausea.       Right-sided abdominal pain, chronic, none now.  Patient is due for diagnostic upper endoscopy in coming weeks  Musculoskeletal: Negative.   Skin: Negative.   Neurological: Negative.   Psychiatric/Behavioral: Negative.   All other systems reviewed and are negative.    Physical Exam Updated Vital Signs Ht 5\' 5"  (1.651 m)   Wt 73.9 kg   BMI 27.12 kg/m   Physical Exam  Constitutional: She appears well-developed and well-nourished.  HENT:  Head: Normocephalic and atraumatic.  Eyes: Pupils are equal, round, and reactive to light. Conjunctivae are normal.  Neck: Neck supple. No tracheal deviation present. No  thyromegaly present.  Cardiovascular: Normal rate and regular rhythm.  No murmur heard. Pulmonary/Chest: Effort normal and breath sounds normal.  Abdominal: Soft. Bowel sounds are normal. She exhibits no distension. There is no tenderness.  Musculoskeletal: Normal range of motion. She exhibits no edema or tenderness.  Neurological: She is alert. Coordination normal.  Skin: Skin is warm and dry. No rash noted.  Psychiatric: She has a normal mood and affect.  Nursing note and vitals reviewed.    ED Treatments / Results  Labs (all labs ordered are listed, but only abnormal results are displayed) Labs Reviewed  CBC WITH DIFFERENTIAL/PLATELET  I-STAT CHEM 8, ED  I-STAT TROPONIN, ED    EKG EKG Interpretation  Date/Time:  Saturday February 07 2018 12:42:56 EST Ventricular Rate:  75 PR Interval:    QRS Duration: 116 QT Interval:  393 QTC Calculation: 439 R Axis:   46 Text Interpretation:  Sinus rhythm Nonspecific intraventricular conduction delay Minimal ST depression, lateral leads Baseline wander in lead(s) I III aVR aVL No significant change since last tracing Confirmed by Doug Sou [phone]) on 02/07/2018 1:23:47 PM   Radiology No results found.  Procedures Procedures (including critical care time)  Medications Ordered in ED Medications - No data to display Chest x-ray viewed by me Results for orders placed or performed during the hospital encounter of 02/07/18  CBC with Differential/Platelet  Result Value Ref Range   WBC 6.4 4.0 - 10.5 K/uL   RBC 5.10 3.87 - 5.11 MIL/uL   Hemoglobin 14.7 12.0 - 15.0 g/dL   HCT 19.1 47.8 - 29.5 %   MCV 86.7 80.0 - 100.0 fL   MCH 28.8 26.0 - 34.0 pg   MCHC 33.3 30.0 - 36.0 g/dL   RDW 62.1 30.8 - 65.7 %   Platelets 268 150 - 400 K/uL   nRBC 0.0 0.0 - 0.2 %   Neutrophils Relative % 51 %   Neutro Abs 3.3 1.7 - 7.7 K/uL   Lymphocytes Relative 37 %   Lymphs Abs 2.3 0.7 - 4.0 K/uL   Monocytes Relative 10 %   Monocytes Absolute  0.7 0.1 - 1.0 K/uL   Eosinophils Relative 2 %   Eosinophils Absolute 0.1 0.0 - 0.5 K/uL   Basophils Relative 0 %   Basophils Absolute 0.0 0.0 - 0.1 K/uL   Immature Granulocytes 0 %   Abs Immature Granulocytes 0.01 0.00 - 0.07 K/uL  I-stat chem 8, ed  Result Value Ref Range   Sodium 139 135 - 145 mmol/L   Potassium 4.0 3.5 - 5.1 mmol/L   Chloride 103 98 - 111 mmol/L   BUN 15 6 - 20 mg/dL   Creatinine, Ser 8.46 0.44 - 1.00 mg/dL  Glucose, Bld 90 70 - 99 mg/dL   Calcium, Ion 9.62 9.52 - 1.40 mmol/L   TCO2 25 22 - 32 mmol/L   Hemoglobin 14.6 12.0 - 15.0 g/dL   HCT 84.1 32.4 - 40.1 %  I-stat troponin, ED  Result Value Ref Range   Troponin i, poc 0.00 0.00 - 0.08 ng/mL   Comment 3          I-stat troponin, ED  Result Value Ref Range   Troponin i, poc 0.00 0.00 - 0.08 ng/mL   Comment 3           Dg Chest Port 1 View  Result Date: 02/07/2018 CLINICAL DATA:  Chest discomfort EXAM: PORTABLE CHEST 1 VIEW COMPARISON:  Chest radiograph 10/28/2003 FINDINGS: Monitoring leads overlie the patient. Stable cardiac and mediastinal contours. No consolidative pulmonary opacities. No pleural effusion or pneumothorax. IMPRESSION: No acute cardiopulmonary process. Electronically Signed   By: Annia Belt M.D.   On: 02/07/2018 13:43    Initial Impression / Assessment and Plan / ED Course  I have reviewed the triage vital signs and the nursing notes.  Pertinent labs & imaging results that were available during my care of the patient were reviewed by me and considered in my medical decision making (see chart for details).    4:30 PM patient remains asymptomatic except for intermittent nausea and occasional twinges of vague discomfort in her chest and headache. Chest pain felt to be highly atypical for acute coronary syndrome.  Heart score equals 2 Plan follow-up with PMD Dr. Carlota Raspberry Final Clinical Impressions(s) / ED Diagnoses  Dxatypical chest pain Final diagnoses:  None    ED Discharge Orders     None       Doug Sou, MD 02/07/18 1636

## 2018-02-07 NOTE — Discharge Instructions (Signed)
Call Dr. Zachery Dauer on Monday, February 09, 2018 to arrange for follow-up.  Tell office staff that you were seen here today.

## 2018-02-07 NOTE — ED Notes (Signed)
Patient verbalizes understanding of discharge instructions. Opportunity for questioning and answers were provided. Pt discharged from ED. 

## 2019-03-31 ENCOUNTER — Ambulatory Visit: Payer: 59 | Attending: Internal Medicine

## 2019-03-31 DIAGNOSIS — U071 COVID-19: Secondary | ICD-10-CM

## 2019-04-01 LAB — NOVEL CORONAVIRUS, NAA: SARS-CoV-2, NAA: NOT DETECTED

## 2019-04-28 DIAGNOSIS — R87610 Atypical squamous cells of undetermined significance on cytologic smear of cervix (ASC-US): Secondary | ICD-10-CM | POA: Diagnosis not present

## 2019-05-26 DIAGNOSIS — M722 Plantar fascial fibromatosis: Secondary | ICD-10-CM | POA: Diagnosis not present

## 2019-05-26 DIAGNOSIS — M79672 Pain in left foot: Secondary | ICD-10-CM | POA: Diagnosis not present

## 2019-05-26 DIAGNOSIS — M25572 Pain in left ankle and joints of left foot: Secondary | ICD-10-CM | POA: Diagnosis not present

## 2019-05-26 DIAGNOSIS — M7732 Calcaneal spur, left foot: Secondary | ICD-10-CM | POA: Diagnosis not present

## 2019-06-03 DIAGNOSIS — M71572 Other bursitis, not elsewhere classified, left ankle and foot: Secondary | ICD-10-CM | POA: Diagnosis not present

## 2019-06-03 DIAGNOSIS — M722 Plantar fascial fibromatosis: Secondary | ICD-10-CM | POA: Diagnosis not present

## 2019-06-24 DIAGNOSIS — M71572 Other bursitis, not elsewhere classified, left ankle and foot: Secondary | ICD-10-CM | POA: Diagnosis not present

## 2019-06-24 DIAGNOSIS — M722 Plantar fascial fibromatosis: Secondary | ICD-10-CM | POA: Diagnosis not present

## 2019-07-08 DIAGNOSIS — M722 Plantar fascial fibromatosis: Secondary | ICD-10-CM | POA: Diagnosis not present

## 2019-07-08 DIAGNOSIS — M71572 Other bursitis, not elsewhere classified, left ankle and foot: Secondary | ICD-10-CM | POA: Diagnosis not present

## 2019-07-22 DIAGNOSIS — H35361 Drusen (degenerative) of macula, right eye: Secondary | ICD-10-CM | POA: Diagnosis not present

## 2019-07-22 DIAGNOSIS — H2513 Age-related nuclear cataract, bilateral: Secondary | ICD-10-CM | POA: Diagnosis not present

## 2019-07-22 DIAGNOSIS — M722 Plantar fascial fibromatosis: Secondary | ICD-10-CM | POA: Diagnosis not present

## 2019-07-22 DIAGNOSIS — Z83518 Family history of other specified eye disorder: Secondary | ICD-10-CM | POA: Diagnosis not present

## 2019-07-22 DIAGNOSIS — H04123 Dry eye syndrome of bilateral lacrimal glands: Secondary | ICD-10-CM | POA: Diagnosis not present

## 2019-08-29 ENCOUNTER — Ambulatory Visit (HOSPITAL_COMMUNITY)
Admission: EM | Admit: 2019-08-29 | Discharge: 2019-08-29 | Disposition: A | Discharge status: Home / Self Care | Payer: BC Managed Care – PPO | Attending: Family Medicine | Admitting: Family Medicine

## 2019-08-29 ENCOUNTER — Other Ambulatory Visit: Payer: Self-pay

## 2019-08-29 ENCOUNTER — Encounter (HOSPITAL_COMMUNITY): Payer: Self-pay

## 2019-08-29 DIAGNOSIS — H5789 Other specified disorders of eye and adnexa: Secondary | ICD-10-CM

## 2019-08-29 MED ORDER — METHYLPREDNISOLONE SODIUM SUCC 125 MG IJ SOLR
80.0000 mg | Freq: Once | INTRAMUSCULAR | Status: AC
  Administered 2019-08-29: 80 mg via INTRAMUSCULAR

## 2019-08-29 MED ORDER — METHYLPREDNISOLONE SODIUM SUCC 125 MG IJ SOLR
INTRAMUSCULAR | Status: AC
  Filled 2019-08-29: qty 2

## 2019-08-29 NOTE — ED Triage Notes (Signed)
Pt presents with rash on both eyelids that burns and has some mild itching.

## 2019-08-29 NOTE — Discharge Instructions (Signed)
Believe this is some sort of allergic reaction or contact irritant that has caused this. Steroid injection given here in clinic for your symptoms.  You can keep on doing the Benadryl or you can do Zyrtec daily Cool compresses to the eyes Follow up as needed for continued or worsening symptoms

## 2019-08-30 NOTE — ED Provider Notes (Signed)
[place]    CSN: [id number] Arrival date & time: 08/29/19  1742      History   Chief Complaint Chief Complaint  Patient presents with  . Rash    HPI Michelle Fletcher is a 59 y.o. female.   Patient is a 59 year old female presents today with rash to bilateral eyelids.  This is been constant and worsening over the past day or so.  Describes the pain as burning and mild itching.  Generalized swelling and erythema.  Has been outside but no specific contact with poison ivy.  No trouble with vision or eye pain.  Had similar instance a few weeks ago and took Benadryl with relief.  This time she is taken Benadryl but the symptoms are worsening.  She has started a new cream at that time and discontinued this.  As far she knows no new facial products, soaps, lotion, detergents, new foods or medicines. ROS per HPI      Past Medical History:  Diagnosis Date  . Depression   . Endometriosis   . Reiter's syndrome ([place])   . Thyroid disease   . Vitamin D deficiency     There are no problems to display for this patient.   Past Surgical History:  Procedure Laterality Date  . CESAREAN SECTION     x2  . CHOLECYSTECTOMY    . TONSILLECTOMY      OB History   No obstetric history on file.      Home Medications    Prior to Admission medications   Medication Sig Start Date End Date Taking? Authorizing Provider  aspirin 81 MG tablet Take 81 mg by mouth daily.    [provider]  buPROPion (WELLBUTRIN SR) 150 MG 12 hr tablet Take 150 mg by mouth 2 (two) times daily.     [provider]  Calcium Carb-Cholecalciferol (OS-CAL PO) Take 1 tablet by mouth every morning.    [provider]  Cholecalciferol (VITAMIN D3) 1.25 MG (50000 UT) TABS Take 1 tablet by mouth at bedtime.    [provider]  drospirenone-ethinyl estradiol (YAZ,GIANVI,LORYNA) 3-0.02 MG tablet Take 1 tablet by mouth daily.    [provider]    glucosamine-chondroitin 500-400 MG tablet Take 1 tablet by mouth every morning.    [provider]  Levothyroxine Sodium (SYNTHROID PO) Take 0.1 mg by mouth at bedtime.     [provider]  Probiotic Product (PROBIOTIC-10) CAPS Take 1 capsule by mouth every morning.    [provider]  vitamin C (ASCORBIC ACID) 500 MG tablet Take 500 mg by mouth at bedtime.    [provider]    Family History Family History  Problem Relation Age of Onset  . Hypertension Mother   . Hypertension Father   . Hypertension Brother     Social History Social History   Tobacco Use  . Smoking status: Never Smoker  . Smokeless tobacco: Never Used  Substance Use Topics  . Alcohol use: No  . Drug use: No     Allergies   Sulfa antibiotics   Review of Systems Review of Systems   Physical Exam Triage Vital Signs ED Triage Vitals  Enc Vitals Group     BP 08/29/19 1819 (!) 145/70     Pulse Rate 08/29/19 1819 77     Resp 08/29/19 1819 17     Temp 08/29/19 1819 98.3 F (36.8 C)     Temp Source 08/29/19 1819 Oral  SpO2 08/29/19 1819 99 %     Weight --      Height --      Head Circumference --      Peak Flow --      Pain Score 08/29/19 1820 5     Pain Loc --      Pain Edu? --      Excl. in GC? --    No data found.  Updated Vital Signs BP (!) 145/70 (BP Location: Right Arm)   Pulse 77   Temp 98.3 F (36.8 C) (Oral)   Resp 17   SpO2 99%   Visual Acuity Right Eye Distance:   Left Eye Distance:   Bilateral Distance:    Right Eye Near:   Left Eye Near:    Bilateral Near:     Physical Exam Vitals and nursing note reviewed.  Constitutional:      General: She is not in acute distress.    Appearance: Normal appearance. She is not ill-appearing, toxic-appearing or diaphoretic.  HENT:     Head: Normocephalic.     Nose: Nose normal.     Mouth/Throat:     Pharynx: Oropharynx is clear.  Eyes:     Extraocular Movements: Extraocular movements  intact.     Conjunctiva/sclera: Conjunctivae normal.     Pupils: Pupils are equal, round, and reactive to light.     Comments: Bilateral upper lid swelling with generalized erythema.  Worse on left No obvious rash No pain with EOM No periorbital pain with palpation  Pulmonary:     Effort: Pulmonary effort is normal.  Musculoskeletal:        General: Normal range of motion.     Cervical back: Normal range of motion.  Skin:    General: Skin is warm and dry.     Findings: No rash.  Neurological:     Mental Status: She is alert.  Psychiatric:        Mood and Affect: Mood normal.      UC Treatments / Results  Labs (all labs ordered are listed, but only abnormal results are displayed) Labs Reviewed - No data to display  EKG   Radiology No results found.  Procedures Procedures (including critical care time)  Medications Ordered in UC Medications  methylPREDNISolone sodium succinate (SOLU-MEDROL) 125 mg/2 mL injection 80 mg (80 mg Intramuscular Given 08/29/19 1834)    Initial Impression / Assessment and Plan / UC Course  I have reviewed the triage vital signs and the nursing notes.  Pertinent labs & imaging results that were available during my care of the patient were reviewed by me and considered in my medical decision making (see chart for details).     Bilateral lid swelling Most likely some sort of allergic reaction. No concern for cellulitis at this time Will give Solu-Medrol injection here in clinic and have her continue the Benadryl or Zyrtec Recommended close watch and follow-up for any continued or worsening problems. Final Clinical Impressions(s) / UC Diagnoses   Final diagnoses:  Eye swelling, bilateral     Discharge Instructions     Believe this is some sort of allergic reaction or contact irritant that has caused this. Steroid injection given here in clinic for your symptoms.  You can keep on doing the Benadryl or you can do Zyrtec daily Cool  compresses to the eyes Follow up as needed for continued or worsening symptoms     ED Prescriptions    None  PDMP not reviewed this encounter.   Janace Aris, NP 08/30/19 1044

## 2019-09-02 DIAGNOSIS — M0230 Reiter's disease, unspecified site: Secondary | ICD-10-CM | POA: Diagnosis not present

## 2019-09-02 DIAGNOSIS — E7800 Pure hypercholesterolemia, unspecified: Secondary | ICD-10-CM | POA: Diagnosis not present

## 2019-09-02 DIAGNOSIS — Z23 Encounter for immunization: Secondary | ICD-10-CM | POA: Diagnosis not present

## 2019-09-02 DIAGNOSIS — I499 Cardiac arrhythmia, unspecified: Secondary | ICD-10-CM | POA: Diagnosis not present

## 2019-09-02 DIAGNOSIS — Z0000 Encounter for general adult medical examination without abnormal findings: Secondary | ICD-10-CM | POA: Diagnosis not present

## 2019-09-02 DIAGNOSIS — E559 Vitamin D deficiency, unspecified: Secondary | ICD-10-CM | POA: Diagnosis not present

## 2019-09-02 DIAGNOSIS — E039 Hypothyroidism, unspecified: Secondary | ICD-10-CM | POA: Diagnosis not present

## 2019-09-02 DIAGNOSIS — M8588 Other specified disorders of bone density and structure, other site: Secondary | ICD-10-CM | POA: Diagnosis not present

## 2019-09-08 ENCOUNTER — Encounter: Payer: Self-pay | Admitting: Cardiovascular Disease

## 2019-09-08 ENCOUNTER — Telehealth: Payer: Self-pay | Admitting: Radiology

## 2019-09-08 ENCOUNTER — Ambulatory Visit (INDEPENDENT_AMBULATORY_CARE_PROVIDER_SITE_OTHER): Payer: BC Managed Care – PPO | Admitting: Cardiovascular Disease

## 2019-09-08 ENCOUNTER — Other Ambulatory Visit: Payer: Self-pay

## 2019-09-08 VITALS — BP 134/88 | HR 89 | Ht 65.0 in | Wt 170.8 lb

## 2019-09-08 DIAGNOSIS — R002 Palpitations: Secondary | ICD-10-CM

## 2019-09-08 DIAGNOSIS — E782 Mixed hyperlipidemia: Secondary | ICD-10-CM | POA: Diagnosis not present

## 2019-09-08 DIAGNOSIS — E785 Hyperlipidemia, unspecified: Secondary | ICD-10-CM | POA: Insufficient documentation

## 2019-09-08 NOTE — Patient Instructions (Signed)
Medication Instructions:  The current medical regimen is effective;  continue present plan and medications.  *If you need a refill on your cardiac medications before your next appointment, please call your pharmacy*   Testing/Procedures: Your physician has recommended that you wear a 14 DAY ZIO-PATCH monitor. The Zio patch cardiac monitor continuously records heart rhythm data for up to 14 days, this is for patients being evaluated for multiple types heart rhythms. For the first 24 hours post application, please avoid getting the Zio monitor wet in the shower or by excessive sweating during exercise. After that, feel free to carry on with regular activities. Keep soaps and lotions away from the ZIO XT Patch.  This will be mailed to you, please expect 7-10 days to receive.         Follow-Up: At [facility], you and your health needs are our priority.  As part of our continuing mission to provide you with exceptional heart care, we have created designated Provider Care Teams.  These Care Teams include your primary Cardiologist (physician) and Advanced Practice Providers (APPs -  Physician Assistants and Nurse Practitioners) who all work together to provide you with the care you need, when you need it.  We recommend signing up for the patient portal called "MyChart".  Sign up information is provided on this After Visit Summary.  MyChart is used to connect with patients for Virtual Visits (Telemedicine).  Patients are able to view lab/test results, encounter notes, upcoming appointments, etc.  Non-urgent messages can be sent to your provider as well.   To learn more about what you can do with MyChart, go to [email or web address].    Your next appointment:   As needed  The format for your next appointment:   In Person  Provider:   Jonathan Berry, MD      

## 2019-09-08 NOTE — Progress Notes (Signed)
09/08/2019 Michelle Fletcher   30-Oct-1960  [id number]  Primary Physician Juluis Rainier, MD Primary Cardiologist: Runell Gess MD FACP, [place], [place], [place]  HPI:  Michelle Fletcher is a 59 y.o. mild to moderately overweight married Caucasian female mother of 2 with no grandchildren who is referred by Dr. Juluis Rainier for cardiovascular dilation because of palpitation.  She does not work.  She has no cardiac risk factors other than mild hyperlipidemia with a recent lipid profile performed 09/02/2019 revealing total cholesterol 208, LDL of 127 HDL 59.  She never had a heart attack or stroke.  There is no family history for heart disease.  She denies chest pain or shortness of breath.  She does workout on a treadmill and does yoga.  She has palpitations that occur several times a week.  She has had these for years and was evaluated by Dr. Eldridge Dace back in 2010 with a Holter monitor that showed PACs and PVCs.   Current Meds  Medication Sig  . buPROPion (WELLBUTRIN SR) 150 MG 12 hr tablet Take 150 mg by mouth 2 (two) times daily.   . Calcium Carb-Cholecalciferol (OS-CAL PO) Take 1 tablet by mouth every morning.  . Cholecalciferol (VITAMIN D3) 1.25 MG (50000 UT) TABS Take 1 tablet by mouth at bedtime.  . DUEXIS 800-26.6 MG TABS Take 1 tablet by mouth 3 (three) times daily.  Marland Kitchen levothyroxine (SYNTHROID) 100 MCG tablet Take 100 mcg by mouth daily before breakfast.  . Probiotic Product (PROBIOTIC-10) CAPS Take 1 capsule by mouth every morning.  . vitamin C (ASCORBIC ACID) 500 MG tablet Take 500 mg by mouth at bedtime.     Allergies  Allergen Reactions  . Sulfa Antibiotics     Social History   Socioeconomic History  . Marital status: Married    Spouse name: Not on file  . Number of children: Not on file  . Years of education: Not on file  . Highest education level: Not on file  Occupational History  . Not on file  Tobacco Use  . Smoking status: Never Smoker  . Smokeless tobacco:  Never Used  Substance and Sexual Activity  . Alcohol use: No  . Drug use: No  . Sexual activity: Not on file  Other Topics Concern  . Not on file  Social History Narrative  . Not on file   Social Determinants of Health   Financial Resource Strain:   . Difficulty of Paying Living Expenses:   Food Insecurity:   . Worried About Programme researcher, broadcasting/film/video in the Last Year:   . Barista in the Last Year:   Transportation Needs:   . Freight forwarder (Medical):   Marland Kitchen Lack of Transportation (Non-Medical):   Physical Activity:   . Days of Exercise per Week:   . Minutes of Exercise per Session:   Stress:   . Feeling of Stress :   Social Connections:   . Frequency of Communication with Friends and Family:   . Frequency of Social Gatherings with Friends and Family:   . Attends Religious Services:   . Active Member of Clubs or Organizations:   . Attends Banker Meetings:   Marland Kitchen Marital Status:   Intimate Partner Violence:   . Fear of Current or Ex-Partner:   . Emotionally Abused:   Marland Kitchen Physically Abused:   . Sexually Abused:      Review of Systems: General: negative for chills, fever, night sweats or weight  changes.  Cardiovascular: negative for chest pain, dyspnea on exertion, edema, orthopnea, palpitations, paroxysmal nocturnal dyspnea or shortness of breath Dermatological: negative for rash Respiratory: negative for cough or wheezing Urologic: negative for hematuria Abdominal: negative for nausea, vomiting, diarrhea, bright red blood per rectum, melena, or hematemesis Neurologic: negative for visual changes, syncope, or dizziness All other systems reviewed and are otherwise negative except as noted above.    Blood pressure 134/88, pulse 89, height 5\' 5"  (1.651 m), weight 170 lb 12.8 oz (77.5 kg), SpO2 96 %.  General appearance: alert and no distress Neck: no adenopathy, no carotid bruit, no JVD, supple, symmetrical, trachea midline and thyroid not enlarged,  symmetric, no tenderness/mass/nodules Lungs: clear to auscultation bilaterally Heart: regular rate and rhythm, S1, S2 normal, no murmur, click, rub or gallop Extremities: extremities normal, atraumatic, no cyanosis or edema Pulses: 2+ and symmetric Skin: Skin color, texture, turgor normal. No rashes or lesions Neurologic: Alert and oriented X 3, normal strength and tone. Normal symmetric reflexes. Normal coordination and gait  EKG sinus rhythm 89 with incomplete right bundle branch block.  I personally reviewed this EKG.  ASSESSMENT AND PLAN:   Palpitations Ms. Charnley has had palpitations for many years.  She was worked up by Dr. Irish Lack in 2010 with a Holter monitor that showed PACs and PVCs.  She has had these intermittently over the years which have remained fairly constant occurring several times per week.  She does not drink caffeine.  I am going to get a 2-week Zio patch to further evaluate  Hyperlipidemia History of hyperlipidemia with recent lipid profile performed 09/02/2019 revealing total cholesterol 208, LDL 127 and HDL 59.      Lorretta Harp MD [facility] 09/08/2019 3:22 PM

## 2019-09-08 NOTE — Assessment & Plan Note (Signed)
History of hyperlipidemia with recent lipid profile performed 09/02/2019 revealing total cholesterol 208, LDL 127 and HDL 59.

## 2019-09-08 NOTE — Telephone Encounter (Signed)
Enrolled patient for a 14 day Zio monitor to be mailed to patients home.  

## 2019-09-08 NOTE — Assessment & Plan Note (Signed)
Michelle Fletcher has had palpitations for many years.  She was worked up by Dr. Eldridge Dace in 2010 with a Holter monitor that showed PACs and PVCs.  She has had these intermittently over the years which have remained fairly constant occurring several times per week.  She does not drink caffeine.  I am going to get a 2-week Zio patch to further evaluate

## 2019-09-17 ENCOUNTER — Ambulatory Visit (INDEPENDENT_AMBULATORY_CARE_PROVIDER_SITE_OTHER): Payer: BC Managed Care – PPO

## 2019-09-17 DIAGNOSIS — R002 Palpitations: Secondary | ICD-10-CM

## 2019-09-29 DIAGNOSIS — N390 Urinary tract infection, site not specified: Secondary | ICD-10-CM | POA: Diagnosis not present

## 2019-09-29 DIAGNOSIS — L255 Unspecified contact dermatitis due to plants, except food: Secondary | ICD-10-CM | POA: Diagnosis not present

## 2019-09-29 DIAGNOSIS — R300 Dysuria: Secondary | ICD-10-CM | POA: Diagnosis not present

## 2019-09-29 DIAGNOSIS — R319 Hematuria, unspecified: Secondary | ICD-10-CM | POA: Diagnosis not present

## 2019-10-13 DIAGNOSIS — M2550 Pain in unspecified joint: Secondary | ICD-10-CM | POA: Diagnosis not present

## 2019-10-13 DIAGNOSIS — M4690 Unspecified inflammatory spondylopathy, site unspecified: Secondary | ICD-10-CM | POA: Diagnosis not present

## 2019-10-13 DIAGNOSIS — M722 Plantar fascial fibromatosis: Secondary | ICD-10-CM | POA: Diagnosis not present

## 2019-10-13 DIAGNOSIS — H209 Unspecified iridocyclitis: Secondary | ICD-10-CM | POA: Diagnosis not present

## 2019-10-13 IMAGING — DX DG CHEST 1V PORT
1 series · 1 of 1 positions shown · non-contrast
Comparison: Chest radiograph 10/28/2003

CLINICAL DATA: Chest discomfort

EXAM:
PORTABLE CHEST 1 VIEW

[chest]
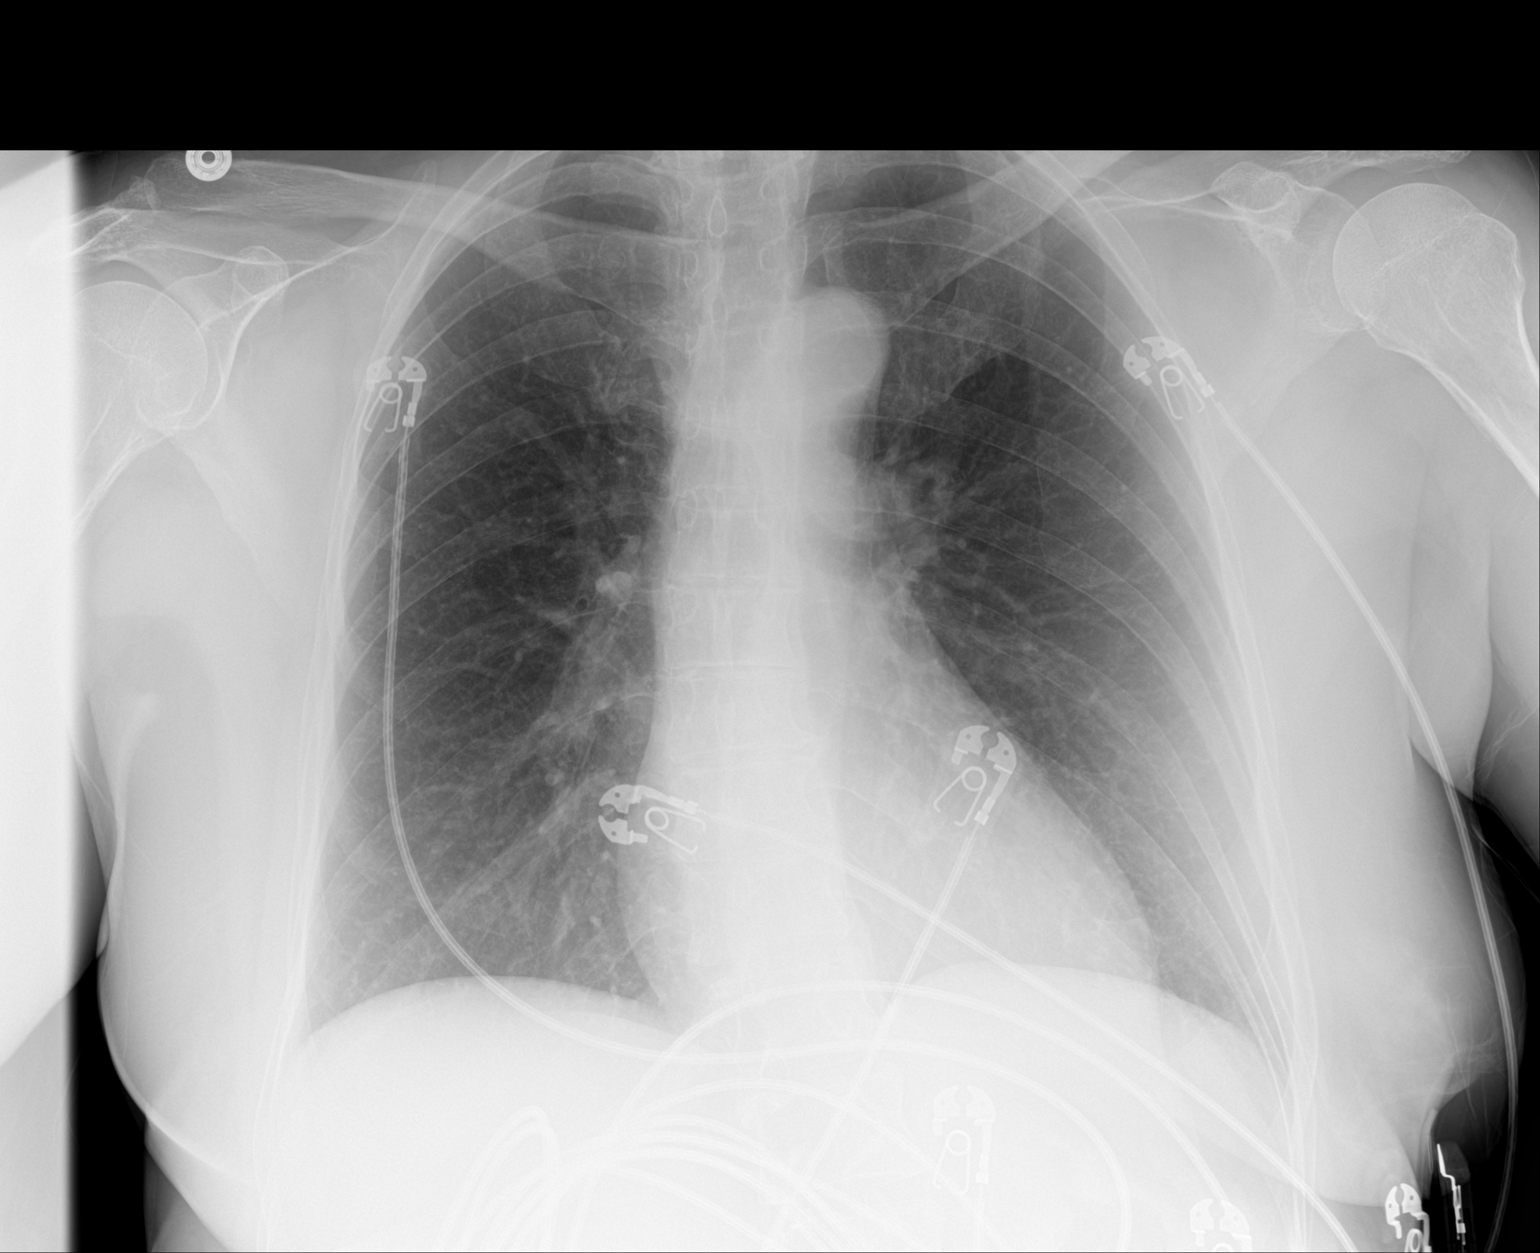

[1 of 1 positions shown; findings below may reference images not displayed]

FINDINGS: Monitoring leads overlie the patient. Stable cardiac and mediastinal
contours. No consolidative pulmonary opacities. No pleural effusion
or pneumothorax.
IMPRESSION: No acute cardiopulmonary process.

## 2019-10-21 DIAGNOSIS — L989 Disorder of the skin and subcutaneous tissue, unspecified: Secondary | ICD-10-CM | POA: Diagnosis not present

## 2019-10-21 DIAGNOSIS — R300 Dysuria: Secondary | ICD-10-CM | POA: Diagnosis not present

## 2019-10-27 DIAGNOSIS — R87610 Atypical squamous cells of undetermined significance on cytologic smear of cervix (ASC-US): Secondary | ICD-10-CM | POA: Diagnosis not present

## 2019-10-27 DIAGNOSIS — Z01419 Encounter for gynecological examination (general) (routine) without abnormal findings: Secondary | ICD-10-CM | POA: Diagnosis not present

## 2019-11-03 DIAGNOSIS — L308 Other specified dermatitis: Secondary | ICD-10-CM | POA: Diagnosis not present

## 2019-11-03 DIAGNOSIS — L218 Other seborrheic dermatitis: Secondary | ICD-10-CM | POA: Diagnosis not present

## 2019-11-10 ENCOUNTER — Telehealth: Payer: Self-pay | Admitting: Cardiovascular Disease

## 2019-11-10 DIAGNOSIS — E782 Mixed hyperlipidemia: Secondary | ICD-10-CM

## 2019-11-10 NOTE — Telephone Encounter (Signed)
Spoke with pt, aware of recommendations dr berry had based on her lipid results. We will place the order for calcium score and send to [facility] for scheduling.

## 2019-11-10 NOTE — Telephone Encounter (Signed)
New message:       Patient returning call back concering some lab results. Patient states that a voice mail can be left.

## 2019-11-17 ENCOUNTER — Other Ambulatory Visit: Payer: Self-pay

## 2019-11-17 ENCOUNTER — Ambulatory Visit (INDEPENDENT_AMBULATORY_CARE_PROVIDER_SITE_OTHER)
Admission: RE | Admit: 2019-11-17 | Discharge: 2019-11-17 | Disposition: A | Discharge status: Home / Self Care | Payer: Self-pay | Source: Ambulatory Visit | Attending: Cardiovascular Disease | Admitting: Cardiovascular Disease

## 2019-11-17 DIAGNOSIS — E782 Mixed hyperlipidemia: Secondary | ICD-10-CM

## 2019-11-23 DIAGNOSIS — Z23 Encounter for immunization: Secondary | ICD-10-CM | POA: Diagnosis not present

## 2020-01-28 ENCOUNTER — Other Ambulatory Visit: Payer: Self-pay | Admitting: Dentist

## 2020-01-28 DIAGNOSIS — S0300XA Dislocation of jaw, unspecified side, initial encounter: Secondary | ICD-10-CM

## 2020-02-20 ENCOUNTER — Other Ambulatory Visit: Payer: BC Managed Care – PPO

## 2020-05-01 DIAGNOSIS — M4690 Unspecified inflammatory spondylopathy, site unspecified: Secondary | ICD-10-CM | POA: Diagnosis not present

## 2020-05-01 DIAGNOSIS — M2560 Stiffness of unspecified joint, not elsewhere classified: Secondary | ICD-10-CM | POA: Diagnosis not present

## 2020-05-01 DIAGNOSIS — M2550 Pain in unspecified joint: Secondary | ICD-10-CM | POA: Diagnosis not present

## 2020-05-01 DIAGNOSIS — H209 Unspecified iridocyclitis: Secondary | ICD-10-CM | POA: Diagnosis not present

## 2020-05-11 DIAGNOSIS — N390 Urinary tract infection, site not specified: Secondary | ICD-10-CM | POA: Diagnosis not present

## 2020-05-25 ENCOUNTER — Other Ambulatory Visit: Payer: BC Managed Care – PPO

## 2020-07-10 DIAGNOSIS — Z6828 Body mass index (BMI) 28.0-28.9, adult: Secondary | ICD-10-CM | POA: Diagnosis not present

## 2020-07-10 DIAGNOSIS — Z01419 Encounter for gynecological examination (general) (routine) without abnormal findings: Secondary | ICD-10-CM | POA: Diagnosis not present

## 2020-07-10 DIAGNOSIS — Z1231 Encounter for screening mammogram for malignant neoplasm of breast: Secondary | ICD-10-CM | POA: Diagnosis not present

## 2020-07-13 DIAGNOSIS — E7800 Pure hypercholesterolemia, unspecified: Secondary | ICD-10-CM | POA: Diagnosis not present

## 2020-07-13 DIAGNOSIS — M0230 Reiter's disease, unspecified site: Secondary | ICD-10-CM | POA: Diagnosis not present

## 2020-07-13 DIAGNOSIS — F419 Anxiety disorder, unspecified: Secondary | ICD-10-CM | POA: Diagnosis not present

## 2020-07-13 DIAGNOSIS — E039 Hypothyroidism, unspecified: Secondary | ICD-10-CM | POA: Diagnosis not present

## 2020-07-27 DIAGNOSIS — E7800 Pure hypercholesterolemia, unspecified: Secondary | ICD-10-CM | POA: Diagnosis not present

## 2020-07-27 DIAGNOSIS — E559 Vitamin D deficiency, unspecified: Secondary | ICD-10-CM | POA: Diagnosis not present

## 2020-07-27 DIAGNOSIS — M79601 Pain in right arm: Secondary | ICD-10-CM | POA: Diagnosis not present

## 2020-09-04 DIAGNOSIS — U071 COVID-19: Secondary | ICD-10-CM | POA: Diagnosis not present

## 2020-09-04 DIAGNOSIS — R051 Acute cough: Secondary | ICD-10-CM | POA: Diagnosis not present

## 2020-09-22 DIAGNOSIS — Z0000 Encounter for general adult medical examination without abnormal findings: Secondary | ICD-10-CM | POA: Diagnosis not present

## 2020-09-26 DIAGNOSIS — M25521 Pain in right elbow: Secondary | ICD-10-CM | POA: Diagnosis not present

## 2020-10-24 DIAGNOSIS — M79601 Pain in right arm: Secondary | ICD-10-CM | POA: Diagnosis not present

## 2020-10-30 DIAGNOSIS — M25511 Pain in right shoulder: Secondary | ICD-10-CM | POA: Diagnosis not present

## 2020-10-30 DIAGNOSIS — M2550 Pain in unspecified joint: Secondary | ICD-10-CM | POA: Diagnosis not present

## 2020-10-30 DIAGNOSIS — H209 Unspecified iridocyclitis: Secondary | ICD-10-CM | POA: Diagnosis not present

## 2020-10-30 DIAGNOSIS — M4690 Unspecified inflammatory spondylopathy, site unspecified: Secondary | ICD-10-CM | POA: Diagnosis not present

## 2020-11-23 ENCOUNTER — Other Ambulatory Visit: Payer: Self-pay | Admitting: Sports Medicine

## 2020-11-23 DIAGNOSIS — M25511 Pain in right shoulder: Secondary | ICD-10-CM

## 2020-11-23 DIAGNOSIS — F40240 Claustrophobia: Secondary | ICD-10-CM | POA: Diagnosis not present

## 2020-11-27 DIAGNOSIS — M79621 Pain in right upper arm: Secondary | ICD-10-CM | POA: Diagnosis not present

## 2020-11-27 DIAGNOSIS — M25511 Pain in right shoulder: Secondary | ICD-10-CM | POA: Diagnosis not present

## 2020-11-29 DIAGNOSIS — M79621 Pain in right upper arm: Secondary | ICD-10-CM | POA: Diagnosis not present

## 2020-11-29 DIAGNOSIS — M25511 Pain in right shoulder: Secondary | ICD-10-CM | POA: Diagnosis not present

## 2020-12-05 DIAGNOSIS — M79621 Pain in right upper arm: Secondary | ICD-10-CM | POA: Diagnosis not present

## 2020-12-05 DIAGNOSIS — M25511 Pain in right shoulder: Secondary | ICD-10-CM | POA: Diagnosis not present

## 2020-12-15 DIAGNOSIS — M25511 Pain in right shoulder: Secondary | ICD-10-CM | POA: Diagnosis not present

## 2020-12-15 DIAGNOSIS — M79621 Pain in right upper arm: Secondary | ICD-10-CM | POA: Diagnosis not present

## 2020-12-19 ENCOUNTER — Other Ambulatory Visit: Payer: BC Managed Care – PPO

## 2020-12-20 DIAGNOSIS — M79621 Pain in right upper arm: Secondary | ICD-10-CM | POA: Diagnosis not present

## 2020-12-20 DIAGNOSIS — M25511 Pain in right shoulder: Secondary | ICD-10-CM | POA: Diagnosis not present

## 2020-12-26 ENCOUNTER — Ambulatory Visit
Admission: RE | Admit: 2020-12-26 | Discharge: 2020-12-26 | Disposition: A | Discharge status: Home / Self Care | Payer: BC Managed Care – PPO | Source: Ambulatory Visit | Attending: Sports Medicine | Admitting: Sports Medicine

## 2020-12-26 DIAGNOSIS — M79621 Pain in right upper arm: Secondary | ICD-10-CM | POA: Diagnosis not present

## 2020-12-26 DIAGNOSIS — M25511 Pain in right shoulder: Secondary | ICD-10-CM

## 2020-12-26 DIAGNOSIS — M19011 Primary osteoarthritis, right shoulder: Secondary | ICD-10-CM | POA: Diagnosis not present

## 2021-01-10 DIAGNOSIS — M25511 Pain in right shoulder: Secondary | ICD-10-CM | POA: Diagnosis not present

## 2021-01-10 DIAGNOSIS — M79621 Pain in right upper arm: Secondary | ICD-10-CM | POA: Diagnosis not present

## 2021-01-24 DIAGNOSIS — M79621 Pain in right upper arm: Secondary | ICD-10-CM | POA: Diagnosis not present

## 2021-01-24 DIAGNOSIS — M25511 Pain in right shoulder: Secondary | ICD-10-CM | POA: Diagnosis not present

## 2021-03-09 DIAGNOSIS — M25561 Pain in right knee: Secondary | ICD-10-CM | POA: Diagnosis not present

## 2021-03-09 DIAGNOSIS — M4690 Unspecified inflammatory spondylopathy, site unspecified: Secondary | ICD-10-CM | POA: Diagnosis not present

## 2021-03-09 DIAGNOSIS — H209 Unspecified iridocyclitis: Secondary | ICD-10-CM | POA: Diagnosis not present

## 2021-06-11 DIAGNOSIS — D1801 Hemangioma of skin and subcutaneous tissue: Secondary | ICD-10-CM | POA: Diagnosis not present

## 2021-06-11 DIAGNOSIS — L821 Other seborrheic keratosis: Secondary | ICD-10-CM | POA: Diagnosis not present

## 2021-06-11 DIAGNOSIS — L710 Perioral dermatitis: Secondary | ICD-10-CM | POA: Diagnosis not present

## 2021-06-11 DIAGNOSIS — L308 Other specified dermatitis: Secondary | ICD-10-CM | POA: Diagnosis not present

## 2021-06-14 DIAGNOSIS — R220 Localized swelling, mass and lump, head: Secondary | ICD-10-CM | POA: Diagnosis not present

## 2021-07-19 DIAGNOSIS — M4690 Unspecified inflammatory spondylopathy, site unspecified: Secondary | ICD-10-CM | POA: Diagnosis not present

## 2021-07-19 DIAGNOSIS — M25561 Pain in right knee: Secondary | ICD-10-CM | POA: Diagnosis not present

## 2021-07-19 DIAGNOSIS — H209 Unspecified iridocyclitis: Secondary | ICD-10-CM | POA: Diagnosis not present

## 2021-07-19 DIAGNOSIS — Z79899 Other long term (current) drug therapy: Secondary | ICD-10-CM | POA: Diagnosis not present

## 2021-08-29 DIAGNOSIS — H04123 Dry eye syndrome of bilateral lacrimal glands: Secondary | ICD-10-CM | POA: Diagnosis not present

## 2021-08-29 DIAGNOSIS — H35361 Drusen (degenerative) of macula, right eye: Secondary | ICD-10-CM | POA: Diagnosis not present

## 2021-08-29 DIAGNOSIS — Z83518 Family history of other specified eye disorder: Secondary | ICD-10-CM | POA: Diagnosis not present

## 2021-08-29 DIAGNOSIS — H2513 Age-related nuclear cataract, bilateral: Secondary | ICD-10-CM | POA: Diagnosis not present

## 2021-09-28 DIAGNOSIS — Z0000 Encounter for general adult medical examination without abnormal findings: Secondary | ICD-10-CM | POA: Diagnosis not present

## 2021-09-28 DIAGNOSIS — Z13220 Encounter for screening for lipoid disorders: Secondary | ICD-10-CM | POA: Diagnosis not present

## 2021-09-28 DIAGNOSIS — E039 Hypothyroidism, unspecified: Secondary | ICD-10-CM | POA: Diagnosis not present

## 2021-09-28 DIAGNOSIS — F339 Major depressive disorder, recurrent, unspecified: Secondary | ICD-10-CM | POA: Diagnosis not present

## 2021-09-28 DIAGNOSIS — F419 Anxiety disorder, unspecified: Secondary | ICD-10-CM | POA: Diagnosis not present

## 2021-09-28 DIAGNOSIS — E559 Vitamin D deficiency, unspecified: Secondary | ICD-10-CM | POA: Diagnosis not present

## 2021-11-27 DIAGNOSIS — G5761 Lesion of plantar nerve, right lower limb: Secondary | ICD-10-CM | POA: Diagnosis not present

## 2021-11-27 DIAGNOSIS — M7751 Other enthesopathy of right foot: Secondary | ICD-10-CM | POA: Diagnosis not present

## 2021-11-27 DIAGNOSIS — M2041 Other hammer toe(s) (acquired), right foot: Secondary | ICD-10-CM | POA: Diagnosis not present

## 2021-12-17 DIAGNOSIS — G5761 Lesion of plantar nerve, right lower limb: Secondary | ICD-10-CM | POA: Diagnosis not present

## 2021-12-26 DIAGNOSIS — Z1231 Encounter for screening mammogram for malignant neoplasm of breast: Secondary | ICD-10-CM | POA: Diagnosis not present

## 2021-12-26 DIAGNOSIS — Z6828 Body mass index (BMI) 28.0-28.9, adult: Secondary | ICD-10-CM | POA: Diagnosis not present

## 2021-12-26 DIAGNOSIS — Z01419 Encounter for gynecological examination (general) (routine) without abnormal findings: Secondary | ICD-10-CM | POA: Diagnosis not present

## 2021-12-26 DIAGNOSIS — Z124 Encounter for screening for malignant neoplasm of cervix: Secondary | ICD-10-CM | POA: Diagnosis not present

## 2022-01-21 DIAGNOSIS — M459 Ankylosing spondylitis of unspecified sites in spine: Secondary | ICD-10-CM | POA: Diagnosis not present

## 2022-01-21 DIAGNOSIS — M4690 Unspecified inflammatory spondylopathy, site unspecified: Secondary | ICD-10-CM | POA: Diagnosis not present

## 2022-01-21 DIAGNOSIS — M25561 Pain in right knee: Secondary | ICD-10-CM | POA: Diagnosis not present

## 2022-01-21 DIAGNOSIS — H209 Unspecified iridocyclitis: Secondary | ICD-10-CM | POA: Diagnosis not present

## 2022-03-28 DIAGNOSIS — Z13820 Encounter for screening for osteoporosis: Secondary | ICD-10-CM | POA: Diagnosis not present

## 2022-04-11 ENCOUNTER — Emergency Department (HOSPITAL_BASED_OUTPATIENT_CLINIC_OR_DEPARTMENT_OTHER): Payer: BC Managed Care – PPO

## 2022-04-11 ENCOUNTER — Other Ambulatory Visit: Payer: Self-pay

## 2022-04-11 ENCOUNTER — Encounter (HOSPITAL_BASED_OUTPATIENT_CLINIC_OR_DEPARTMENT_OTHER): Payer: Self-pay

## 2022-04-11 DIAGNOSIS — Z79899 Other long term (current) drug therapy: Secondary | ICD-10-CM | POA: Diagnosis not present

## 2022-04-11 DIAGNOSIS — R112 Nausea with vomiting, unspecified: Secondary | ICD-10-CM | POA: Diagnosis not present

## 2022-04-11 DIAGNOSIS — R1110 Vomiting, unspecified: Secondary | ICD-10-CM | POA: Diagnosis not present

## 2022-04-11 DIAGNOSIS — N134 Hydroureter: Secondary | ICD-10-CM | POA: Diagnosis not present

## 2022-04-11 DIAGNOSIS — E039 Hypothyroidism, unspecified: Secondary | ICD-10-CM | POA: Diagnosis not present

## 2022-04-11 DIAGNOSIS — R109 Unspecified abdominal pain: Secondary | ICD-10-CM | POA: Diagnosis not present

## 2022-04-11 DIAGNOSIS — N200 Calculus of kidney: Secondary | ICD-10-CM | POA: Diagnosis not present

## 2022-04-11 LAB — COMPREHENSIVE METABOLIC PANEL
ALT: 25 U/L (ref 0–44)
AST: 20 U/L (ref 15–41)
Albumin: 4.6 g/dL (ref 3.5–5.0)
Alkaline Phosphatase: 60 U/L (ref 38–126)
Anion gap: 9 (ref 5–15)
BUN: 30 mg/dL — ABNORMAL HIGH (ref 6–20)
CO2: 27 mmol/L (ref 22–32)
Calcium: 9.8 mg/dL (ref 8.9–10.3)
Chloride: 104 mmol/L (ref 98–111)
Creatinine, Ser: 1.22 mg/dL — ABNORMAL HIGH (ref 0.44–1.00)
GFR, Estimated: 51 mL/min — ABNORMAL LOW (ref >60)
Glucose, Bld: 133 mg/dL — ABNORMAL HIGH (ref 70–99)
Potassium: 4 mmol/L (ref 3.5–5.1)
Sodium: 140 mmol/L (ref 135–145)
Total Bilirubin: 0.3 mg/dL (ref 0.3–1.2)
Total Protein: 7.7 g/dL (ref 6.5–8.1)

## 2022-04-11 LAB — CBC
HCT: 40.2 % (ref 36.0–46.0)
Hemoglobin: 13.6 g/dL (ref 12.0–15.0)
MCH: 29.2 pg (ref 26.0–34.0)
MCHC: 33.8 g/dL (ref 30.0–36.0)
MCV: 86.5 fL (ref 80.0–100.0)
Platelets: 264 10*3/uL (ref 150–400)
RBC: 4.65 MIL/uL (ref 3.87–5.11)
RDW: 11.9 % (ref 11.5–15.5)
WBC: 10.6 10*3/uL — ABNORMAL HIGH (ref 4.0–10.5)
nRBC: 0 % (ref 0.0–0.2)

## 2022-04-11 LAB — LIPASE, BLOOD: Lipase: 13 U/L (ref 11–51)

## 2022-04-11 MED ORDER — IOHEXOL 300 MG/ML  SOLN
100.0000 mL | Freq: Once | INTRAMUSCULAR | Status: AC | PRN
  Administered 2022-04-11: 80 mL via INTRAVENOUS

## 2022-04-11 NOTE — ED Triage Notes (Signed)
Pt c/o "acute pain on L side" onset 2045; states pain has been so intense, it caused "several rounds of vomiting." Movement makes it worse, heat "maybe helped a tiny bit." Denies associated GI/GU symptoms States she took 600mg  ibuprofen PTA, thinks she threw it up.

## 2022-04-12 ENCOUNTER — Emergency Department (HOSPITAL_BASED_OUTPATIENT_CLINIC_OR_DEPARTMENT_OTHER)
Admission: EM | Admit: 2022-04-12 | Discharge: 2022-04-12 | Disposition: A | Discharge status: Home / Self Care | Payer: BC Managed Care – PPO | Attending: Emergency Medicine | Admitting: Emergency Medicine

## 2022-04-12 DIAGNOSIS — N200 Calculus of kidney: Secondary | ICD-10-CM

## 2022-04-12 LAB — URINALYSIS, ROUTINE W REFLEX MICROSCOPIC
Bilirubin Urine: NEGATIVE
Glucose, UA: NEGATIVE mg/dL
Hgb urine dipstick: NEGATIVE
Ketones, ur: NEGATIVE mg/dL
Leukocytes,Ua: NEGATIVE
Nitrite: NEGATIVE
Protein, ur: NEGATIVE mg/dL
Specific Gravity, Urine: >1.046 — ABNORMAL HIGH (ref 1.005–1.030)
pH: 6.5 (ref 5.0–8.0)

## 2022-04-12 MED ORDER — OXYCODONE-ACETAMINOPHEN 5-325 MG PO TABS: 1.0000 | ORAL_TABLET | Freq: Four times a day (QID) | ORAL | 0 refills | Status: DC | PRN

## 2022-04-12 MED ORDER — KETOROLAC TROMETHAMINE 30 MG/ML IJ SOLN
30.0000 mg | Freq: Once | INTRAMUSCULAR | Status: AC
  Administered 2022-04-12: 30 mg via INTRAVENOUS
  Filled 2022-04-12: qty 1

## 2022-04-12 MED ORDER — ONDANSETRON HCL 4 MG/2ML IJ SOLN
4.0000 mg | Freq: Once | INTRAMUSCULAR | Status: AC
  Administered 2022-04-12: 4 mg via INTRAVENOUS
  Filled 2022-04-12: qty 2

## 2022-04-12 MED ORDER — MORPHINE SULFATE (PF) 4 MG/ML IV SOLN
4.0000 mg | Freq: Once | INTRAVENOUS | Status: AC
  Administered 2022-04-12: 4 mg via INTRAVENOUS
  Filled 2022-04-12: qty 1

## 2022-04-12 NOTE — ED Notes (Signed)
Pt drinking water without difficulty.

## 2022-04-12 NOTE — Discharge Instructions (Signed)
Begin taking Percocet as prescribed as needed for pain.  Follow-up with urology if your symptoms have not improved in the next 3 to 4 days.  The contact information for [facility] has been provided in this discharge summary for you to call and make these arrangements.  Return to the emergency department if your symptoms significantly worsen or change.

## 2022-04-12 NOTE — ED Provider Notes (Signed)
[place] EMERGENCY DEPT Provider Note   CSN: [id number] Arrival date & time: 04/11/22  2231     History  Chief Complaint  Patient presents with   Abdominal Pain    Michelle Fletcher is a 62 y.o. female.  Patient is a 62 year old female with past medical history of hypothyroidism and hyperlipidemia.  Patient presenting today with complaints of left flank pain.  This started acutely after leaving church this evening.  It began in the absence of any injury or trauma.  She describes severe pain in her flank that radiates to her abdomen.  She has been nauseated and vomited several times.  She denies any fevers or chills.  She denies any bowel or bladder complaints.  There are no aggravating or alleviating factors.  The history is provided by the patient.       Home Medications Prior to Admission medications   Medication Sig Start Date End Date Taking? Authorizing Provider  buPROPion (WELLBUTRIN SR) 150 MG 12 hr tablet Take 150 mg by mouth 2 (two) times daily.     [provider]  Calcium Carb-Cholecalciferol (OS-CAL PO) Take 1 tablet by mouth every morning.    [provider]  Cholecalciferol (VITAMIN D3) 1.25 MG (50000 UT) TABS Take 1 tablet by mouth at bedtime.    [provider]  DUEXIS 800-26.6 MG TABS Take 1 tablet by mouth 3 (three) times daily. 08/23/19   [provider]  levothyroxine (SYNTHROID) 100 MCG tablet Take 100 mcg by mouth daily before breakfast.    [provider]  Probiotic Product (PROBIOTIC-10) CAPS Take 1 capsule by mouth every morning.    [provider]  vitamin C (ASCORBIC ACID) 500 MG tablet Take 500 mg by mouth at bedtime.    [provider]      Allergies    Sulfa antibiotics    Review of Systems   Review of Systems  All other systems reviewed and are negative.   Physical Exam Updated Vital Signs BP 120/62   Pulse 74   Temp 97.6 F (36.4 C)   Resp 18   SpO2 95%   Physical Exam Vitals and nursing note reviewed.  Constitutional:      General: She is not in acute distress.    Appearance: She is well-developed. She is not diaphoretic.  HENT:     Head: Normocephalic and atraumatic.  Cardiovascular:     Rate and Rhythm: Normal rate and regular rhythm.     Heart sounds: No murmur heard.    No friction rub. No gallop.  Pulmonary:     Effort: Pulmonary effort is normal. No respiratory distress.     Breath sounds: Normal breath sounds. No wheezing.  Abdominal:     General: Bowel sounds are normal. There is no distension.     Palpations: Abdomen is soft.     Tenderness: There is no abdominal tenderness. There is left CVA tenderness. There is no right CVA tenderness, guarding or rebound.  Musculoskeletal:        General: Normal range of motion.     Cervical back: Normal range of motion and neck supple.  Skin:    General: Skin is warm and dry.  Neurological:     General: No focal deficit present.     Mental Status: She is alert and oriented to person, place, and time.     ED Results / Procedures / Treatments   Labs (all labs ordered are listed, but only abnormal results  are displayed) Labs Reviewed  COMPREHENSIVE METABOLIC PANEL - Abnormal; Notable for the following components:      Result Value   Glucose, Bld 133 (*)    BUN 30 (*)    Creatinine, Ser 1.22 (*)    GFR, Estimated 51 (*)    All other components within normal limits  CBC - Abnormal; Notable for the following components:   WBC 10.6 (*)    All other components within normal limits  URINALYSIS, ROUTINE W REFLEX MICROSCOPIC - Abnormal; Notable for the following components:   Color, Urine COLORLESS (*)    Specific Gravity, Urine >1.046 (*)    All other components within normal limits  LIPASE, BLOOD    EKG None  Radiology CT ABDOMEN PELVIS W CONTRAST  Result Date: 04/11/2022 CLINICAL DATA:  Acute abdominal pain on the left side with vomiting EXAM: CT ABDOMEN AND PELVIS WITH  CONTRAST TECHNIQUE: Multidetector CT imaging of the abdomen and pelvis was performed using the standard protocol following bolus administration of intravenous contrast. RADIATION DOSE REDUCTION: This exam was performed according to the departmental dose-optimization program which includes automated exposure control, adjustment of the mA and/or kV according to patient size and/or use of iterative reconstruction technique. CONTRAST:  53mL OMNIPAQUE IOHEXOL 300 MG/ML  SOLN COMPARISON:  CT 07/03/2016 FINDINGS: Lower chest: No acute abnormality. Hepatobiliary: No focal liver abnormality is seen. Status post cholecystectomy. No biliary dilatation. Pancreas: Unremarkable. No pancreatic ductal dilatation or surrounding inflammatory changes. Spleen: No splenic injury or perisplenic hematoma. Adrenals/Urinary Tract: Normal adrenal glands. Nonobstructing calyceal stones bilaterally measuring up to 7 mm in the left kidney. Mild left hydroureteronephrosis upstream from a 4 mm stone at the left UVJ. Unremarkable bladder. Stomach/Bowel: Normal caliber large and small bowel unremarkable appendix. Vascular/Lymphatic: No significant vascular findings are present. No enlarged abdominal or pelvic lymph nodes. Reproductive: Uterus and bilateral adnexa are unremarkable. Other: No free intraperitoneal fluid or air. Musculoskeletal: No acute fracture. IMPRESSION: 4 mm stone in the left UVJ with mild left hydroureteronephrosis. Electronically Signed   By: Minerva Fester M.D.   On: 04/11/2022 23:46    Procedures Procedures    Medications Ordered in ED Medications  iohexol (OMNIPAQUE) 300 MG/ML solution 100 mL (80 mLs Intravenous Contrast Given 04/11/22 2331)  ondansetron (ZOFRAN) injection 4 mg (4 mg Intravenous Given 04/12/22 0144)  morphine (PF) 4 MG/ML injection 4 mg (4 mg Intravenous Given 04/12/22 0144)  ketorolac (TORADOL) 30 MG/ML injection 30 mg (30 mg Intravenous Given 04/12/22 0144)    ED Course/ Medical Decision Making/  A&P  Patient presenting here with complaints of left flank pain starting acutely this evening.  This was associated with nausea and vomiting.  Patient arrives here uncomfortable, but with stable vital signs and is afebrile.  There is left-sided CVA tenderness, but abdominal exam is basically benign.  Workup initiated in triage including CBC and metabolic panel along with urinalysis.  White count is 10.6, but CBC otherwise unremarkable.  Basic metabolic panel is basically unremarkable.  CT renal obtained showing a 4 mm stone at the left UVJ with left hydroureteronephrosis.  Patient given Toradol, Zofran, and morphine for pain and nausea and seems to be feeling significantly improved.  I feel as though she can safely be discharged with pain medication and as needed follow-up with urology if the stone has not passed in a timely fashion.   Final Clinical Impression(s) / ED Diagnoses Final diagnoses:  None    Rx / DC Orders ED Discharge Orders  None         Veryl Speak, MD 04/12/22 [phone]

## 2022-04-17 DIAGNOSIS — N202 Calculus of kidney with calculus of ureter: Secondary | ICD-10-CM | POA: Diagnosis not present

## 2022-04-18 ENCOUNTER — Other Ambulatory Visit: Payer: Self-pay | Admitting: Urology

## 2022-04-19 ENCOUNTER — Encounter (HOSPITAL_BASED_OUTPATIENT_CLINIC_OR_DEPARTMENT_OTHER): Payer: Self-pay | Admitting: Urology

## 2022-04-19 NOTE — Progress Notes (Signed)
Pt returned call. Instructions given. Hx and meds reviewed.Arrival time 1030, clear liquids until 0430. Husband is the driver. To stop diclofenac 48 hrs before procedure. Bring in blue folder

## 2022-04-26 ENCOUNTER — Ambulatory Visit (HOSPITAL_BASED_OUTPATIENT_CLINIC_OR_DEPARTMENT_OTHER)
Admission: RE | Admit: 2022-04-26 | Discharge: 2022-04-26 | Disposition: A | Discharge status: Home / Self Care | Payer: BC Managed Care – PPO | Attending: Urology | Admitting: Urology

## 2022-04-26 ENCOUNTER — Ambulatory Visit (HOSPITAL_COMMUNITY): Payer: BC Managed Care – PPO

## 2022-04-26 ENCOUNTER — Other Ambulatory Visit: Payer: Self-pay

## 2022-04-26 ENCOUNTER — Encounter (HOSPITAL_BASED_OUTPATIENT_CLINIC_OR_DEPARTMENT_OTHER)
Admission: RE | Disposition: A | Discharge status: Home / Self Care | Payer: Self-pay | Source: Home / Self Care | Attending: Urology

## 2022-04-26 ENCOUNTER — Encounter (HOSPITAL_BASED_OUTPATIENT_CLINIC_OR_DEPARTMENT_OTHER): Payer: Self-pay | Admitting: Urology

## 2022-04-26 DIAGNOSIS — N200 Calculus of kidney: Secondary | ICD-10-CM | POA: Insufficient documentation

## 2022-04-26 DIAGNOSIS — Z9049 Acquired absence of other specified parts of digestive tract: Secondary | ICD-10-CM | POA: Diagnosis not present

## 2022-04-26 DIAGNOSIS — M419 Scoliosis, unspecified: Secondary | ICD-10-CM | POA: Diagnosis not present

## 2022-04-26 HISTORY — PX: EXTRACORPOREAL SHOCK WAVE LITHOTRIPSY: SHX1557

## 2022-04-26 HISTORY — DX: Hypothyroidism, unspecified: E03.9

## 2022-04-26 SURGERY — LITHOTRIPSY, ESWL
Anesthesia: LOCAL | Laterality: Left

## 2022-04-26 MED ORDER — CIPROFLOXACIN HCL 500 MG PO TABS
ORAL_TABLET | ORAL | Status: AC
  Filled 2022-04-26: qty 1

## 2022-04-26 MED ORDER — DIAZEPAM 5 MG PO TABS
ORAL_TABLET | ORAL | Status: AC
  Filled 2022-04-26: qty 2

## 2022-04-26 MED ORDER — SODIUM CHLORIDE 0.9 % IV SOLN
INTRAVENOUS | Status: DC
  Administered 2022-04-26: 1000 mL via INTRAVENOUS

## 2022-04-26 MED ORDER — DIAZEPAM 5 MG PO TABS
10.0000 mg | ORAL_TABLET | ORAL | Status: AC
  Administered 2022-04-26: 10 mg via ORAL

## 2022-04-26 MED ORDER — DIPHENHYDRAMINE HCL 25 MG PO CAPS
25.0000 mg | ORAL_CAPSULE | ORAL | Status: AC
  Administered 2022-04-26: 25 mg via ORAL

## 2022-04-26 MED ORDER — DIPHENHYDRAMINE HCL 25 MG PO CAPS
ORAL_CAPSULE | ORAL | Status: AC
  Filled 2022-04-26: qty 1

## 2022-04-26 MED ORDER — ALFUZOSIN HCL ER 10 MG PO TB24: 10.0000 mg | ORAL_TABLET | Freq: Every day | ORAL | 0 refills | Status: AC

## 2022-04-26 MED ORDER — CIPROFLOXACIN HCL 500 MG PO TABS
500.0000 mg | ORAL_TABLET | ORAL | Status: AC
  Administered 2022-04-26: 500 mg via ORAL

## 2022-04-26 NOTE — Op Note (Signed)
See [facility] OP note scanned into chart. Also because of the size, density, location and other factors that cannot be anticipated I feel this will likely be a staged procedure. This fact supersedes any indication in the scanned [facility] operative note to the contrary.  

## 2022-04-26 NOTE — H&P (Signed)
CC/HPI: cc: Urolithiasis   04/17/2022: 62 year old woman presented with acute onset flank pain with nausea and vomiting found to have a 4 mm left UVJ calculus as well as 7 mm nonobstructing renal calculus. This is patient's first stone episode. Her pain is resolved. She denies any gross hematuria. She is unsure if she has passed the stone. Her son is getting married in July and she is traveling out of the country in the fall and would like to take care of the stone before that.     ALLERGIES: Sulfa Antibiotics     MEDICATIONS: Levothyroxine Sodium 100 mcg capsule  Alprazolam 0.25 mg tablet  Betamethasone Dipropionate  Bupropion Hcl Sr 150 mg tablet, extended release 12 hr  Diclofenac  Duexis 800 mg-26.6 mg tablet  Os-Cal 500-Vit D3 500 mg calcium-15 mcg (600 unit) tablet  Oxycodone-Acetaminophen 5 mg-325 mg tablet  Probiotic  Synthroid 100 mcg tablet  Vitamin C  Vitamin D3     GU PSH: None   NON-GU PSH: Cesarean Delivery Remove Gallbladder     GU PMH: None   NON-GU PMH: Arthritis Depression Hypothyroidism    FAMILY HISTORY: Cancer - Mother Kidney Stones - Father   SOCIAL HISTORY: Marital Status: Married Preferred Language: English; Ethnicity: Not Hispanic Or Latino; Race: White Current Smoking Status: Patient has never smoked.   Tobacco Use Assessment Completed: Used Tobacco in last 30 days? Has never drank.  Does not drink caffeine.    REVIEW OF SYSTEMS:    GU Review Female:   Patient reports get up at night to urinate. Patient denies frequent urination, hard to postpone urination, burning /pain with urination, leakage of urine, stream starts and stops, trouble starting your stream, have to strain to urinate, and being pregnant.  Gastrointestinal (Upper):   Patient reports nausea and vomiting. Patient denies indigestion/ heartburn.  Gastrointestinal (Lower):   Patient denies diarrhea and constipation.  Constitutional:   Patient denies fever, night sweats, weight  loss, and fatigue.  Skin:   Patient denies skin rash/ lesion and itching.  Eyes:   Patient denies blurred vision and double vision.  Ears/ Nose/ Throat:   Patient denies sinus problems and sore throat.  Hematologic/Lymphatic:   Patient reports easy bruising. Patient denies swollen glands.  Cardiovascular:   Patient denies leg swelling and chest pains.  Respiratory:   Patient denies cough and shortness of breath.  Endocrine:   Patient denies excessive thirst.  Musculoskeletal:   Patient denies back pain and joint pain.  Neurological:   Patient denies headaches and dizziness.  Psychologic:   Patient denies depression and anxiety.   VITAL SIGNS:      04/17/2022 09:43 AM  Weight 165 lb / 74.84 kg  Height 65 in / 165.1 cm  BP 127/84 mmHg  Pulse 76 /min  Temperature 97.7 F / 36.5 C  BMI 27.5 kg/m   MULTI-SYSTEM PHYSICAL EXAMINATION:    Constitutional: Well-nourished. No physical deformities. Normally developed. Good grooming.  Neck: Neck symmetrical, not swollen. Normal tracheal position.  Respiratory: No labored breathing, no use of accessory muscles.   Skin: No paleness, no jaundice, no cyanosis. No lesion, no ulcer, no rash.  Neurologic / Psychiatric: Oriented to time, oriented to place, oriented to person. No depression, no anxiety, no agitation.  Eyes: Normal conjunctivae. Normal eyelids.  Ears, Nose, Mouth, and Throat: Left ear no scars, no lesions, no masses. Right ear no scars, no lesions, no masses. Nose no scars, no lesions, no masses. Normal hearing. Normal lips.  Musculoskeletal:  Normal gait and station of head and neck.     Complexity of Data:  Records Review:   Previous Patient Records, POC Tool  Urine Test Review:   Urinalysis  X-Ray Review: KUB: Reviewed Films. Discussed With Patient. 7 mm stone overlying expected location of left kidney and between L2 and L1 C.T. Abdomen/Pelvis: Reviewed Films. Reviewed Report. Discussed With Patient. IMPRESSION:  4 mm stone in the  left UVJ with mild left hydroureteronephrosis.    Electronically Signed  By: Placido Sou M.D.  On: 04/11/2022 23:46   Notes:                     04/11/2022: BUN 30, creatinine 1.22   PROCEDURES:         KUB - 74018  A single view of the abdomen is obtained.      Patient confirmed No Neulasta OnPro Device.           Urinalysis w/Scope Dipstick Dipstick Cont'd Micro  Color: Yellow Bilirubin: Neg mg/dL WBC/hpf: 10 - 20/hpf  Appearance: Slightly Cloudy Ketones: Neg mg/dL RBC/hpf: NS (Not Seen)  Specific Gravity: 1.020 Blood: Neg ery/uL Bacteria: Many (>50/hpf)  pH: 6.0 Protein: Neg mg/dL Cystals: NS (Not Seen)  Glucose: Neg mg/dL Urobilinogen: 0.2 mg/dL Casts: NS (Not Seen)    Nitrites: Neg Trichomonas: Not Present    Leukocyte Esterase: 2+ leu/uL Mucous: Present      Epithelial Cells: 6 - 10/hpf      Yeast: NS (Not Seen)      Sperm: Not Present    Notes: Unconcentrated microscopic exam due to qns    ASSESSMENT:      ICD-10 Details  1 GU:   Renal calculus - N20.0 Chronic, Stable  2   Ureteral calculus - G64.4 Acute, Uncomplicated   PLAN:           Orders Labs BMP  X-Rays: KUB          Schedule         Document Letter(s):  Created for Patient: Clinical Summary         Notes:   Urolithiasis:  -We discussed stone prevention strategies including drinking 2 to 3 L of water a day, limiting animal protein and limiting salt  -No calcifications seen in pelvis and patient is likely passed ureteral calculus  -There is a 7 mm calcification overlying expected area of left kidney corresponding to stone seen in renal pelvis on CT scan  -We discussed observation, ESWL and ureteroscopy. Risks and benefits of all of these were discussed. Patient has elected to move forward with ESWL and will be scheduled for the next available date. Risks include but are not limited to pain, bleeding, failure to fragment stone, need for additional treatment, ureteral obstruction, renal  hematoma/perinephric bleed, page kidney. Patient understands these risks and wishes to schedule.  -check BMP today as Cr 1.22 in ED

## 2022-04-26 NOTE — Discharge Instructions (Signed)
See [facility] discharge instructions in chart.  

## 2022-04-28 ENCOUNTER — Emergency Department (HOSPITAL_BASED_OUTPATIENT_CLINIC_OR_DEPARTMENT_OTHER)
Admission: EM | Admit: 2022-04-28 | Discharge: 2022-04-28 | Disposition: A | Discharge status: Home / Self Care | Payer: BC Managed Care – PPO | Source: Home / Self Care | Attending: Emergency Medicine | Admitting: Emergency Medicine

## 2022-04-28 ENCOUNTER — Emergency Department (HOSPITAL_BASED_OUTPATIENT_CLINIC_OR_DEPARTMENT_OTHER): Payer: BC Managed Care – PPO | Admitting: Radiology

## 2022-04-28 ENCOUNTER — Emergency Department (HOSPITAL_COMMUNITY)
Admission: EM | Admit: 2022-04-28 | Discharge: 2022-04-28 | Discharge status: Against Medical Advice | Payer: BC Managed Care – PPO | Attending: Emergency Medicine | Admitting: Emergency Medicine

## 2022-04-28 ENCOUNTER — Other Ambulatory Visit: Payer: Self-pay

## 2022-04-28 DIAGNOSIS — Z5321 Procedure and treatment not carried out due to patient leaving prior to being seen by health care provider: Secondary | ICD-10-CM | POA: Insufficient documentation

## 2022-04-28 DIAGNOSIS — N200 Calculus of kidney: Secondary | ICD-10-CM

## 2022-04-28 DIAGNOSIS — R1110 Vomiting, unspecified: Secondary | ICD-10-CM | POA: Insufficient documentation

## 2022-04-28 DIAGNOSIS — R109 Unspecified abdominal pain: Secondary | ICD-10-CM | POA: Insufficient documentation

## 2022-04-28 LAB — URINALYSIS, ROUTINE W REFLEX MICROSCOPIC
Bacteria, UA: NONE SEEN
Bilirubin Urine: NEGATIVE
Glucose, UA: NEGATIVE mg/dL
Ketones, ur: 20 mg/dL — AB
Nitrite: NEGATIVE
Protein, ur: NEGATIVE mg/dL
RBC / HPF: >50 RBC/hpf (ref 0–5)
Specific Gravity, Urine: 1.027 (ref 1.005–1.030)
pH: 5 (ref 5.0–8.0)

## 2022-04-28 LAB — CBC
HCT: 38.7 % (ref 36.0–46.0)
Hemoglobin: 13.2 g/dL (ref 12.0–15.0)
MCH: 29.5 pg (ref 26.0–34.0)
MCHC: 34.1 g/dL (ref 30.0–36.0)
MCV: 86.4 fL (ref 80.0–100.0)
Platelets: 243 10*3/uL (ref 150–400)
RBC: 4.48 MIL/uL (ref 3.87–5.11)
RDW: 11.7 % (ref 11.5–15.5)
WBC: 11.4 10*3/uL — ABNORMAL HIGH (ref 4.0–10.5)
nRBC: 0 % (ref 0.0–0.2)

## 2022-04-28 LAB — COMPREHENSIVE METABOLIC PANEL
ALT: 36 U/L (ref 0–44)
AST: 22 U/L (ref 15–41)
Albumin: 4.1 g/dL (ref 3.5–5.0)
Alkaline Phosphatase: 56 U/L (ref 38–126)
Anion gap: 8 (ref 5–15)
BUN: 22 mg/dL — ABNORMAL HIGH (ref 6–20)
CO2: 23 mmol/L (ref 22–32)
Calcium: 9 mg/dL (ref 8.9–10.3)
Chloride: 101 mmol/L (ref 98–111)
Creatinine, Ser: 0.96 mg/dL (ref 0.44–1.00)
GFR, Estimated: >60 mL/min (ref >60)
Glucose, Bld: 138 mg/dL — ABNORMAL HIGH (ref 70–99)
Potassium: 3.7 mmol/L (ref 3.5–5.1)
Sodium: 132 mmol/L — ABNORMAL LOW (ref 135–145)
Total Bilirubin: 0.6 mg/dL (ref 0.3–1.2)
Total Protein: 7.2 g/dL (ref 6.5–8.1)

## 2022-04-28 LAB — LIPASE, BLOOD: Lipase: 28 U/L (ref 11–51)

## 2022-04-28 MED ORDER — MORPHINE SULFATE 15 MG PO TABS: 15.0000 mg | ORAL_TABLET | ORAL | 0 refills | Status: DC | PRN

## 2022-04-28 MED ORDER — ONDANSETRON 4 MG PO TBDP: ORAL_TABLET | ORAL | 0 refills | Status: DC

## 2022-04-28 MED ORDER — SODIUM CHLORIDE 0.9 % IV BOLUS
1000.0000 mL | Freq: Once | INTRAVENOUS | Status: AC
  Administered 2022-04-28: 1000 mL via INTRAVENOUS

## 2022-04-28 MED ORDER — MORPHINE SULFATE (PF) 4 MG/ML IV SOLN
4.0000 mg | Freq: Once | INTRAVENOUS | Status: AC
  Administered 2022-04-28: 4 mg via INTRAVENOUS
  Filled 2022-04-28: qty 1

## 2022-04-28 MED ORDER — KETOROLAC TROMETHAMINE 15 MG/ML IJ SOLN
15.0000 mg | Freq: Once | INTRAMUSCULAR | Status: AC
  Administered 2022-04-28: 15 mg via INTRAVENOUS
  Filled 2022-04-28: qty 1

## 2022-04-28 MED ORDER — ONDANSETRON HCL 4 MG/2ML IJ SOLN
4.0000 mg | Freq: Once | INTRAMUSCULAR | Status: AC
  Administered 2022-04-28: 4 mg via INTRAVENOUS
  Filled 2022-04-28: qty 2

## 2022-04-28 MED ORDER — ONDANSETRON 4 MG PO TBDP
4.0000 mg | ORAL_TABLET | Freq: Once | ORAL | Status: AC | PRN
  Administered 2022-04-28: 4 mg via ORAL
  Filled 2022-04-28: qty 1

## 2022-04-28 NOTE — ED Provider Notes (Signed)
[facility] Provider Note   CSN: [id number] Arrival date & time: 04/28/22  5366     History  Chief Complaint  Patient presents with   Flank Pain    Michelle Fletcher is a 62 y.o. female.  62 yo F with a cc of L flank pain.  Patient with recent lithotripsy.  No fevers.  Due to the discomfort patient has not been able to keep anything down.  She tried some crackers she tried her pain medicine she tried her nausea medicine at home but without improvement.  She called the urology office who encouraged her to come to the emergency department for evaluation.  They went to the [facility] emergency department but due to prolonged wait to left and came here to be seen.   Flank Pain       Home Medications Prior to Admission medications   Medication Sig Start Date End Date Taking? Authorizing Provider  morphine (MSIR) 15 MG tablet Take 1 tablet (15 mg total) by mouth every 4 (four) hours as needed for severe pain. 04/28/22  Yes Deno Etienne, DO  ondansetron (ZOFRAN-ODT) 4 MG disintegrating tablet 4mg  ODT q4 hours prn nausea/vomit 04/28/22  Yes Deno Etienne, DO  alfuzosin (UROXATRAL) 10 MG 24 hr tablet Take 1 tablet (10 mg total) by mouth daily with breakfast. 04/26/22   Robley Fries, MD  buPROPion (WELLBUTRIN SR) 150 MG 12 hr tablet Take 150 mg by mouth 2 (two) times daily.     [provider]  Calcium Carb-Cholecalciferol (OS-CAL PO) Take 1 tablet by mouth every morning.    [provider]  cetirizine (ZYRTEC) 10 MG chewable tablet Chew 10 mg by mouth daily.    [provider]  Cholecalciferol (VITAMIN D3) 1.25 MG (50000 UT) TABS Take 1 tablet by mouth at bedtime.    [provider]  diclofenac (VOLTAREN) 75 MG EC tablet Take 75 mg by mouth 2 (two) times daily.    [provider]  levothyroxine (SYNTHROID) 100 MCG tablet Take 100 mcg by mouth daily before breakfast.    [provider]   oxyCODONE-acetaminophen (PERCOCET) 5-325 MG tablet Take 1-2 tablets by mouth every 6 (six) hours as needed. 04/12/22   Veryl Speak, MD  Probiotic Product (PROBIOTIC-10) CAPS Take 1 capsule by mouth every morning.    [provider]  vitamin C (ASCORBIC ACID) 500 MG tablet Take 500 mg by mouth at bedtime.    [provider]      Allergies    Sulfa antibiotics    Review of Systems   Review of Systems  Genitourinary:  Positive for flank pain.    Physical Exam Updated Vital Signs BP (!) 148/77   Pulse 82   Temp 98.3 F (36.8 C) (Oral)   Resp 17   SpO2 94%  Physical Exam Vitals and nursing note reviewed.  Constitutional:      General: She is not in acute distress.    Appearance: She is well-developed. She is not diaphoretic.  HENT:     Head: Normocephalic and atraumatic.  Eyes:     Pupils: Pupils are equal, round, and reactive to light.  Cardiovascular:     Rate and Rhythm: Normal rate and regular rhythm.     Heart sounds: No murmur heard.    No friction rub. No gallop.  Pulmonary:     Effort: Pulmonary effort is normal.     Breath sounds: No wheezing or rales.  Abdominal:     General: There is no distension.     Palpations: Abdomen is soft.     Tenderness: There is no abdominal tenderness.  Musculoskeletal:        General: No tenderness.     Cervical back: Normal range of motion and neck supple.  Skin:    General: Skin is warm and dry.  Neurological:     Mental Status: She is alert and oriented to person, place, and time.  Psychiatric:        Behavior: Behavior normal.     ED Results / Procedures / Treatments   Labs (all labs ordered are listed, but only abnormal results are displayed) Labs Reviewed - No data to display  EKG None  Radiology DG Abdomen 1 View  Result Date: 04/28/2022 CLINICAL DATA:  62 year old female with recent obstructing left UVJ calculus, bilateral nephrolithiasis on CT Abdomen and Pelvis. Now status post  lithotripsy. EXAM: ABDOMEN - 1 VIEW COMPARISON:  KUB 04/26/2022.  CT Abdomen and Pelvis 04/11/2022. FINDINGS: Single AP supine view at 0735 hours. Bilateral nephrolithiasis poorly visible today. No discrete pelvic stone either. Stable cholecystectomy clips. Negative visible bowel gas. Lumbar spine degeneration and levoconvex scoliosis. No acute osseous abnormality identified. IMPRESSION: Previously demonstrated bilateral urinary calculi poorly visible now, perhaps due to radiographic technique or successful lithotripsy. Electronically Signed   By: Odessa Fleming M.D.   On: 04/28/2022 07:56   DG Abd 1 View  Result Date: 04/28/2022 CLINICAL DATA:  62 year old female with planned lithotripsy. CT Abdomen and Pelvis this month with obstructing left ureterovesical junction stone, bilateral nephrolithiasis. EXAM: ABDOMEN - 1 VIEW COMPARISON:  CT Abdomen and Pelvis 04/11/2022. FINDINGS: Punctate left UVJ stone not apparent on the CT scout view earlier this month, questionably visible in the deep pelvis now near midline (pelvis arrow). Superimposed left greater than right bilateral nephrolithiasis appears stable. Stable cholecystectomy clips. Nonobstructed bowel-gas pattern. Levoconvex lumbar scoliosis. Lumbar spine degeneration. No acute osseous abnormality identified. IMPRESSION: 1. Punctate left UVJ stone questionably visible in the deep pelvis now near midline. 2. Otherwise stable bilateral nephrolithiasis. Electronically Signed   By: Odessa Fleming M.D.   On: 04/28/2022 07:51    Procedures Procedures    Medications Ordered in ED Medications  morphine (PF) 4 MG/ML injection 4 mg (4 mg Intravenous Given 04/28/22 0748)  ondansetron (ZOFRAN) injection 4 mg (4 mg Intravenous Given 04/28/22 0746)  sodium chloride 0.9 % bolus 1,000 mL (1,000 mLs Intravenous New Bag/Given 04/28/22 0744)  ketorolac (TORADOL) 15 MG/ML injection 15 mg (15 mg Intravenous Given 04/28/22 7322)    ED Course/ Medical Decision Making/ A&P                              Medical Decision Making Amount and/or Complexity of Data Reviewed Radiology: ordered.  Risk Prescription drug management.   62 yo F with a chief complaint of left flank pain after lithotripsy a couple days ago.  Will treat pain and nausea.  Bolus of IV fluids.  KUB.  Reassess.  I reviewed the patient's lab work from [facility], no significant anemia no significant leukocytosis no significant electrolyte abnormality.  Renal function at baseline.  UA independently interpreted by me without infection.  KUB here independently interpreted by me without obvious acute finding.  Patient feeling much better on repeat assessment.  Tolerating by mouth.  Will discharge home.  Urology follow-up.  8:43 AM:  I have discussed the  diagnosis/risks/treatment options with the patient and family.  Evaluation and diagnostic testing in the emergency department does not suggest an emergent condition requiring admission or immediate intervention beyond what has been performed at this time.  They will follow up with Urology. We also discussed returning to the ED immediately if new or worsening sx occur. We discussed the sx which are most concerning (e.g., sudden worsening pain, fever, inability to tolerate by mouth) that necessitate immediate return. Medications administered to the patient during their visit and any new prescriptions provided to the patient are listed below.  Medications given during this visit Medications  morphine (PF) 4 MG/ML injection 4 mg (4 mg Intravenous Given 04/28/22 0748)  ondansetron (ZOFRAN) injection 4 mg (4 mg Intravenous Given 04/28/22 0746)  sodium chloride 0.9 % bolus 1,000 mL (1,000 mLs Intravenous New Bag/Given 04/28/22 0744)  ketorolac (TORADOL) 15 MG/ML injection 15 mg (15 mg Intravenous Given 04/28/22 0748)     The patient appears reasonably screen and/or stabilized for discharge and I doubt any other medical condition or other [facility] requiring further screening,  evaluation, or treatment in the ED at this time prior to discharge.          Final Clinical Impression(s) / ED Diagnoses Final diagnoses:  Nephrolithiasis    Rx / DC Orders ED Discharge Orders          Ordered    morphine (MSIR) 15 MG tablet  Every 4 hours PRN        04/28/22 0842    ondansetron (ZOFRAN-ODT) 4 MG disintegrating tablet        04/28/22 0842              Deno Etienne, DO 04/28/22 [phone]

## 2022-04-28 NOTE — Discharge Instructions (Signed)
Please follow-up with your urologist.  I would have you call them in the morning and let them know how you are doing.  Please return to the Emergency Department for fever or if your pain is uncontrolled or you are unable to eat or drink. Take 4 over the counter ibuprofen tablets 3 times a day or 2 over-the-counter naproxen tablets twice a day for pain. Also take tylenol 1000mg (2 extra strength) four times a day.   Then take the pain medicine if you feel like you need it. Narcotics do not help with the pain, they only make you care about it less.  You can become addicted to this, people may break into your house to steal it.  It will constipate you.  If you drive under the influence of this medicine you can get a DUI.

## 2022-04-28 NOTE — ED Triage Notes (Signed)
Pt reports left side abdominal pain that began last night. Pt reports she had a lithotripsy done on Friday for a kidney stone on the right side. Pt reports emesis as well. Pt denies any urinary symptoms.

## 2022-04-28 NOTE — ED Triage Notes (Signed)
Pt to triage c/o left side flank pain N/V 8/10 ache in nature x 24 hours following lithotripsy on Friday. Pt had lab work completed earlier today @ [facility] ER but left due to extended wait in lobby. Pt a/o x 4 VSS NAD.

## 2022-04-29 ENCOUNTER — Encounter (HOSPITAL_BASED_OUTPATIENT_CLINIC_OR_DEPARTMENT_OTHER): Payer: Self-pay | Admitting: Urology

## 2022-05-01 ENCOUNTER — Ambulatory Visit (INDEPENDENT_AMBULATORY_CARE_PROVIDER_SITE_OTHER): Payer: BC Managed Care – PPO | Admitting: Podiatry

## 2022-05-01 DIAGNOSIS — M2041 Other hammer toe(s) (acquired), right foot: Secondary | ICD-10-CM | POA: Diagnosis not present

## 2022-05-01 NOTE — Progress Notes (Signed)
Subjective:  Patient ID: Michelle Fletcher, female    DOB: 1961/03/25,  MRN: [id number]  Chief Complaint  Patient presents with   Foot Problem    Right foot, 2nd and 3rd digits pressing against each other, several months     62 y.o. female presents with the above complaint.  Patient presents with complaint of right second and third digit hammertoe contracture mild in nature.  She states that they rub against each of these causes several months she started to notice some discomfort.  She just wanted to get it eval make sure that there is nothing concerning.  She has not seen anyone else prior to seeing me pain scale is 2 out of 10 more achy in nature especially when the rubbing.  She is not a diabetic.  No open wounds or lesion noted   Review of Systems: Negative except as noted in the HPI. Denies N/V/F/Ch.  Past Medical History:  Diagnosis Date   Depression    Endometriosis    Hypothyroidism    Reiter's syndrome    Thyroid disease    Vitamin D deficiency     Current Outpatient Medications:    alfuzosin (UROXATRAL) 10 MG 24 hr tablet, Take 1 tablet (10 mg total) by mouth daily with breakfast., Disp: 15 tablet, Rfl: 0   buPROPion (WELLBUTRIN SR) 150 MG 12 hr tablet, Take 150 mg by mouth 2 (two) times daily. , Disp: , Rfl:    Calcium Carb-Cholecalciferol (OS-CAL PO), Take 1 tablet by mouth every morning., Disp: , Rfl:    cetirizine (ZYRTEC) 10 MG chewable tablet, Chew 10 mg by mouth daily., Disp: , Rfl:    Cholecalciferol (VITAMIN D3) 1.25 MG (50000 UT) TABS, Take 1 tablet by mouth at bedtime., Disp: , Rfl:    diclofenac (VOLTAREN) 75 MG EC tablet, Take 75 mg by mouth 2 (two) times daily., Disp: , Rfl:    levothyroxine (SYNTHROID) 100 MCG tablet, Take 100 mcg by mouth daily before breakfast., Disp: , Rfl:    morphine (MSIR) 15 MG tablet, Take 1 tablet (15 mg total) by mouth every 4 (four) hours as needed for severe pain., Disp: 5 tablet, Rfl: 0   ondansetron (ZOFRAN-ODT) 4 MG  disintegrating tablet, 4mg  ODT q4 hours prn nausea/vomit, Disp: 20 tablet, Rfl: 0   oxyCODONE-acetaminophen (PERCOCET) 5-325 MG tablet, Take 1-2 tablets by mouth every 6 (six) hours as needed., Disp: 20 tablet, Rfl: 0   Probiotic Product (PROBIOTIC-10) CAPS, Take 1 capsule by mouth every morning., Disp: , Rfl:    vitamin C (ASCORBIC ACID) 500 MG tablet, Take 500 mg by mouth at bedtime., Disp: , Rfl:   Social History   Tobacco Use  Smoking Status Never  Smokeless Tobacco Never    Allergies  Allergen Reactions   Sulfa Antibiotics    Objective:  There were no vitals filed for this visit. There is no height or weight on file to calculate BMI. Constitutional Well developed. Well nourished.  Vascular Dorsalis pedis pulses palpable bilaterally. Posterior tibial pulses palpable bilaterally. Capillary refill normal to all digits.  No cyanosis or clubbing noted. Pedal hair growth normal.  Neurologic Normal speech. Oriented to person, place, and time. Epicritic sensation to light touch grossly present bilaterally.  Dermatologic Nails well groomed and normal in appearance. No open wounds. No skin lesions.  Orthopedic: Pain on palpation right second and third hammertoe contracture.  Semiflexible in nature hammertoe contracture second third digit noted.  With the deviation into each other leading to some  hyperkeratotic lesion in between the toes.  No open wounds or lesion noted no other acute complaints noted.   Radiographs: None Assessment:   1. Hammertoe of right foot    Plan:  Patient was evaluated and treated and all questions answered.  Right second and third digit hammertoe contracture mild -All questions and concerns were discussed with the patient in extensive detail -I explained to the patient the etiology of hammertoe contracture.  At this time given that this is very mild in nature I discussed shoe gear modification padding protecting offloading.  If any foot and ankle issues  becomes worse she will come back and see me and we can discuss surgical options.  She states understanding  No follow-ups on file.   Right second and third digit hammertoe mild postoperative shoe gear modification

## 2022-05-15 DIAGNOSIS — N202 Calculus of kidney with calculus of ureter: Secondary | ICD-10-CM | POA: Diagnosis not present

## 2022-06-10 DIAGNOSIS — Z1283 Encounter for screening for malignant neoplasm of skin: Secondary | ICD-10-CM | POA: Diagnosis not present

## 2022-06-10 DIAGNOSIS — D225 Melanocytic nevi of trunk: Secondary | ICD-10-CM | POA: Diagnosis not present

## 2022-07-02 DIAGNOSIS — N200 Calculus of kidney: Secondary | ICD-10-CM | POA: Diagnosis not present

## 2022-08-07 DIAGNOSIS — Z6827 Body mass index (BMI) 27.0-27.9, adult: Secondary | ICD-10-CM | POA: Diagnosis not present

## 2022-08-07 DIAGNOSIS — M436 Torticollis: Secondary | ICD-10-CM | POA: Diagnosis not present

## 2022-08-13 DIAGNOSIS — M459 Ankylosing spondylitis of unspecified sites in spine: Secondary | ICD-10-CM | POA: Diagnosis not present

## 2022-08-13 DIAGNOSIS — H209 Unspecified iridocyclitis: Secondary | ICD-10-CM | POA: Diagnosis not present

## 2022-08-13 DIAGNOSIS — M25561 Pain in right knee: Secondary | ICD-10-CM | POA: Diagnosis not present

## 2022-08-13 DIAGNOSIS — Z79899 Other long term (current) drug therapy: Secondary | ICD-10-CM | POA: Diagnosis not present

## 2022-08-31 IMAGING — MR MR SHOULDER*R* W/O CM
5 series · 36 of 40 positions shown · non-contrast
Comparison: None.

CLINICAL DATA: Right shoulder pain for the past 6 months. No injury
or prior surgery.

EXAM:
MRI OF THE RIGHT SHOULDER WITHOUT CONTRAST
TECHNIQUE: Multiplanar, multisequence MR imaging of the shoulder was performed.
No intravenous contrast was administered.

[Series 3: T2 fat-sat · axial · 4.0mm · 0.55mm/px · z∈[-36,+73]mm · 8 of 25 slices shown (1 of 3)]
[im 1/25]
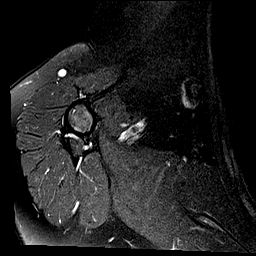
[im 4/25]
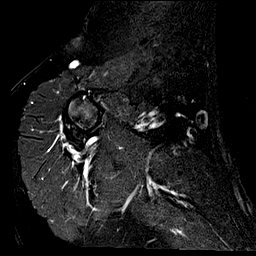
[im 7/25]
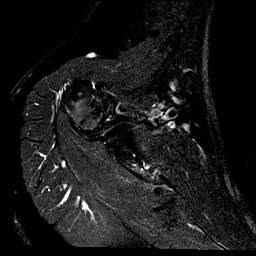
[im 11/25]
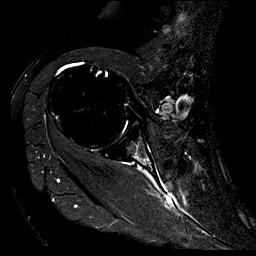
[im 14/25]
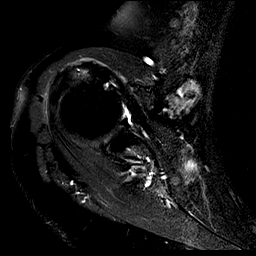
[im 18/25]
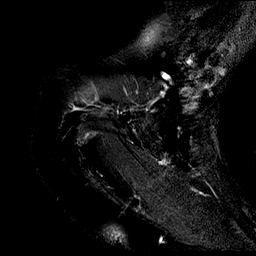
[im 21/25]
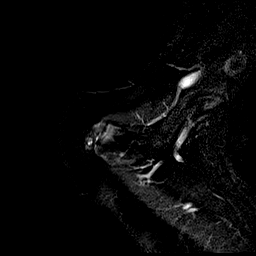
[im 25/25]
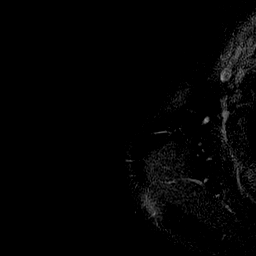

[Series 4: PD · sagittal · 4.0mm · 0.29mm/px · 7 of 21 slices shown]
[im 1/21]
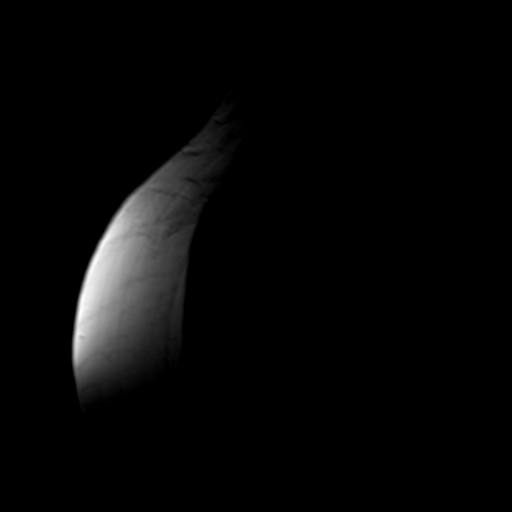
[im 4/21]
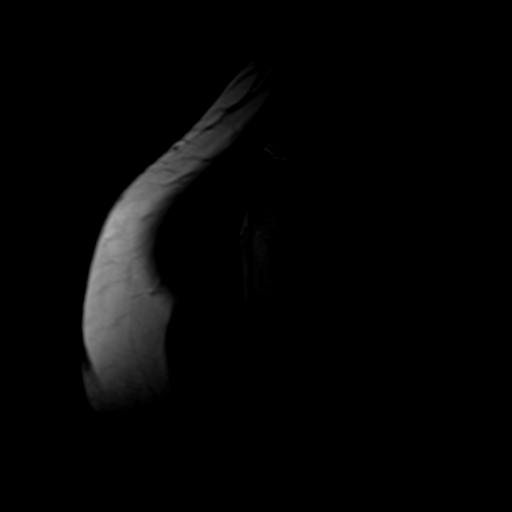
[im 7/21]
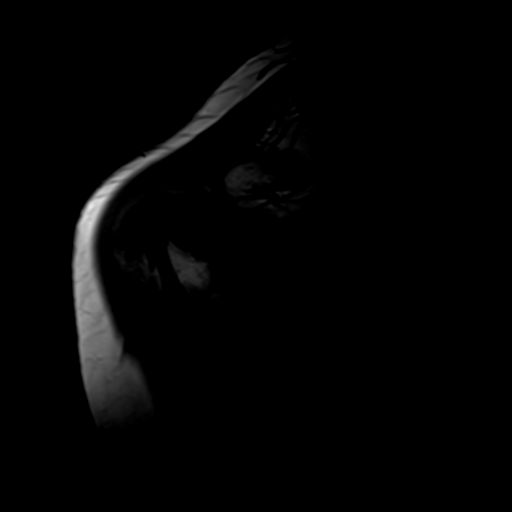
[im 11/21]
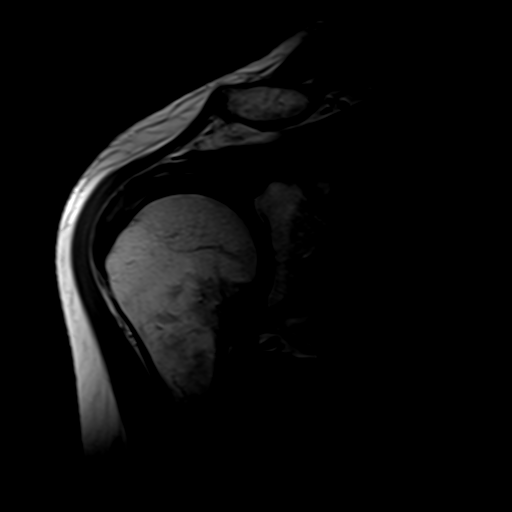
[im 14/21]
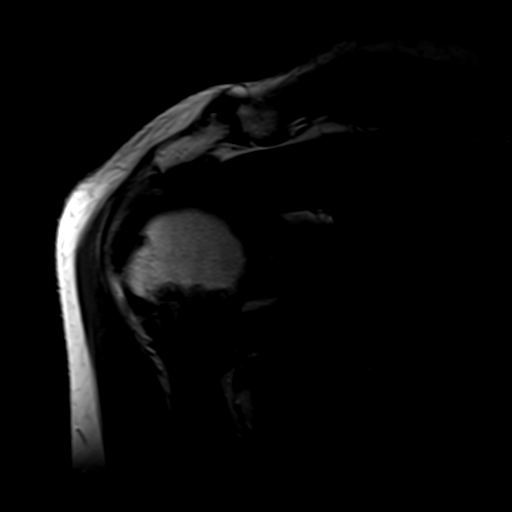
[im 17/21]
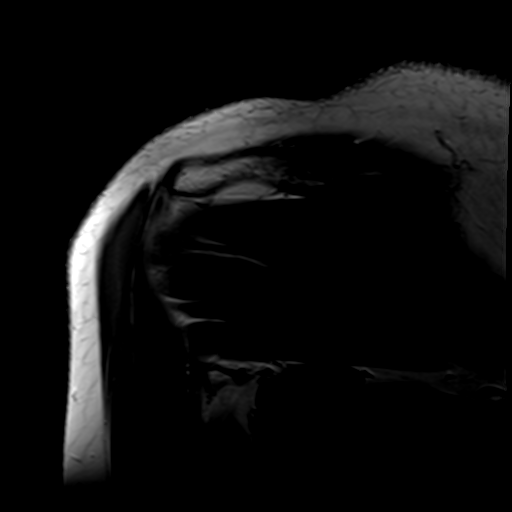
[im 21/21]
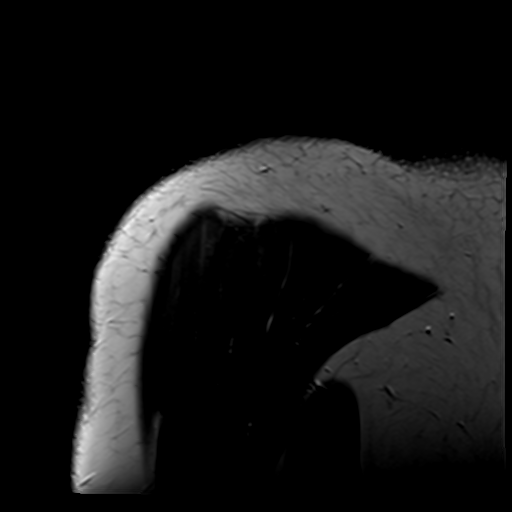

[Series 5: T2 fat-sat · coronal · 4.0mm · 0.59mm/px · 9 of 25 slices shown (2 of 3)]
[im 1/25]
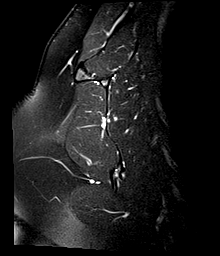
[im 4/25]
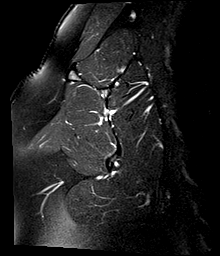
[im 7/25]
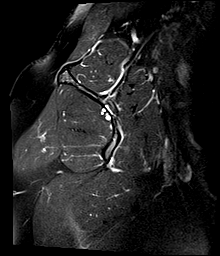
[im 10/25]
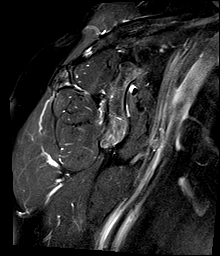
[im 13/25]
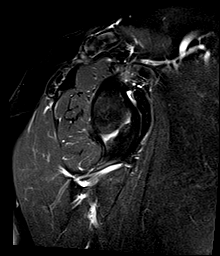
[im 16/25]
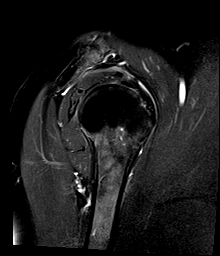
[im 19/25]
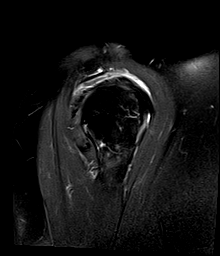
[im 22/25]
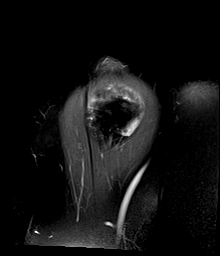
[im 25/25]
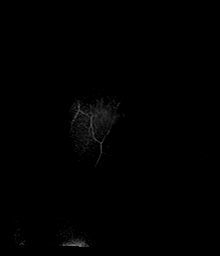

[Series 6: T2 fat-sat · sagittal · 4.0mm · 0.59mm/px · 7 of 21 slices shown (3 of 3)]
[im 1/21]
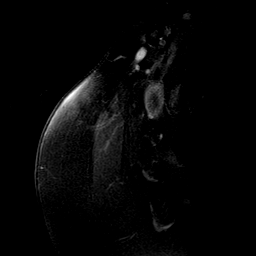
[im 4/21]
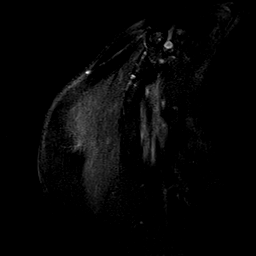
[im 7/21]
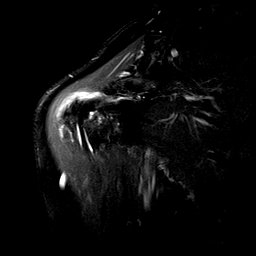
[im 11/21]
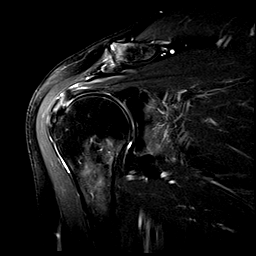
[im 14/21]
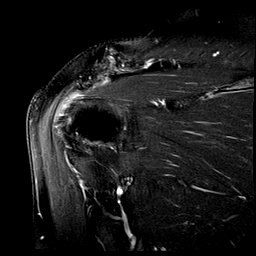
[im 17/21]
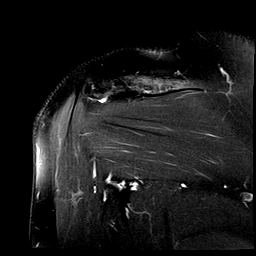
[im 21/21]
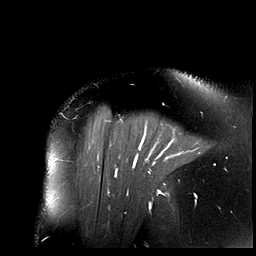

[Series 7: T1 · coronal · 4.0mm · 0.27mm/px · 5 of 25 slices shown]
[im 1/25]
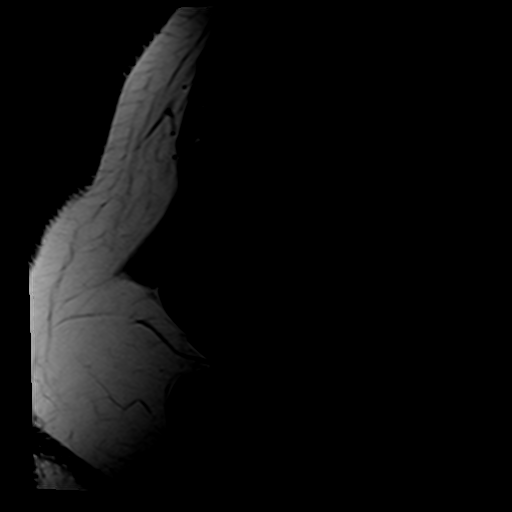
[im 4/25]
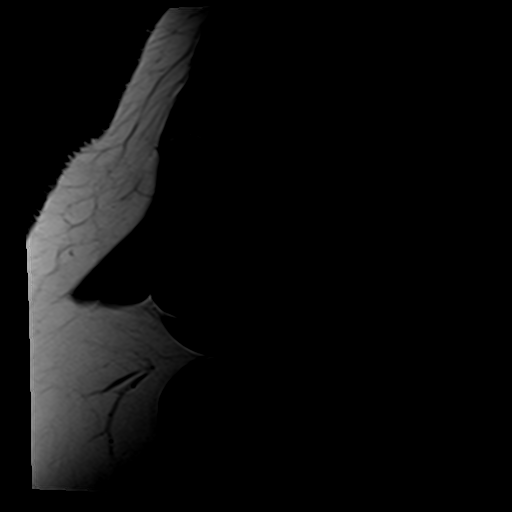
[im 7/25]
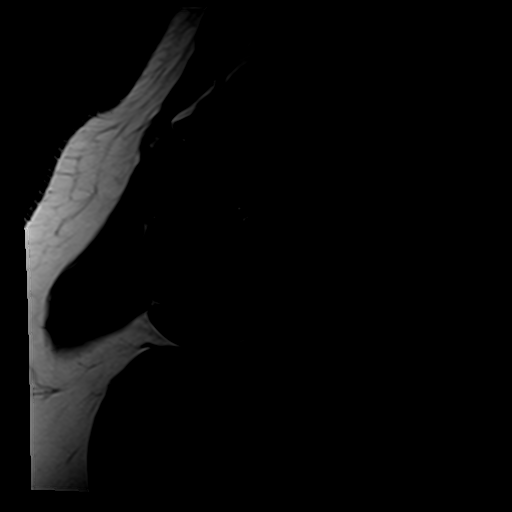
[im 10/25]
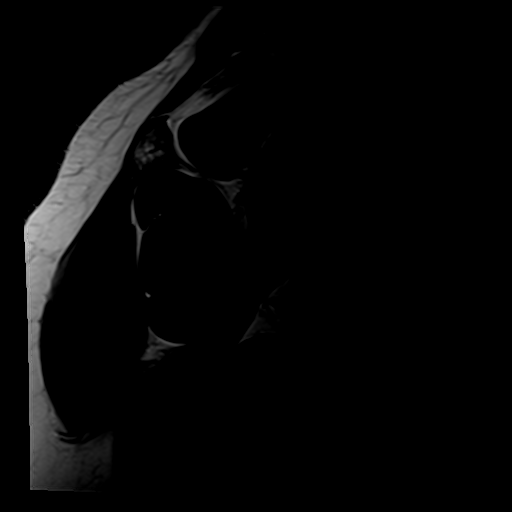
[im 16/25]
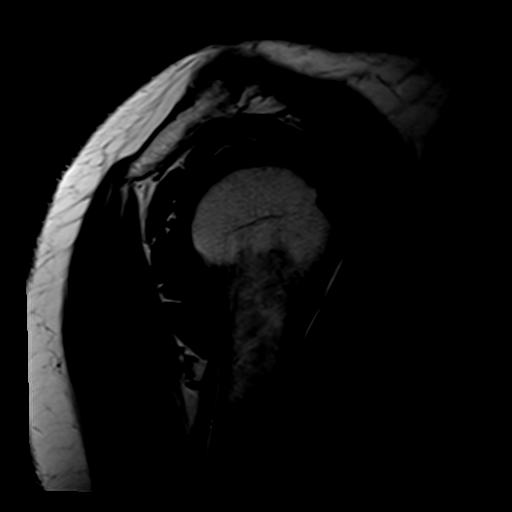

[36 of 40 positions shown; findings below may reference images not displayed]

FINDINGS: Rotator cuff: Small focal full-thickness tear of the distal
supraspinatus tendon (series 6, image 12). Small low-grade
partial-thickness articular surface tear of the distal subscapularis
tendon at the insertion. The infraspinatus and teres minor tendons
are intact.

Muscles: No atrophy or abnormal signal of the muscles of the rotator
cuff.

Biceps long head:  Intact and normally positioned.

Acromioclavicular Joint: Moderate arthropathy of the
acromioclavicular joint. Type I acromion. Trace fluid in the
subacromial/subdeltoid bursa.

Glenohumeral Joint: No joint effusion. Mild diffuse cartilage
thinning without focal defect.

Labrum: Grossly intact, but evaluation is limited by lack of
intraarticular fluid.

Bones: No acute fracture or dislocation. No suspicious bone lesion.

Other: None.
IMPRESSION: 1. Small focal full-thickness tear of the distal supraspinatus
tendon.
2. Small low-grade partial-thickness articular surface tear of the
distal subscapularis tendon at the insertion.
3. Moderate acromioclavicular and mild glenohumeral osteoarthritis.

## 2022-09-25 DIAGNOSIS — L308 Other specified dermatitis: Secondary | ICD-10-CM | POA: Diagnosis not present

## 2022-11-18 DIAGNOSIS — Z13220 Encounter for screening for lipoid disorders: Secondary | ICD-10-CM | POA: Diagnosis not present

## 2022-11-18 DIAGNOSIS — E559 Vitamin D deficiency, unspecified: Secondary | ICD-10-CM | POA: Diagnosis not present

## 2022-11-18 DIAGNOSIS — Z0000 Encounter for general adult medical examination without abnormal findings: Secondary | ICD-10-CM | POA: Diagnosis not present

## 2022-11-22 DIAGNOSIS — E039 Hypothyroidism, unspecified: Secondary | ICD-10-CM | POA: Diagnosis not present

## 2022-11-22 DIAGNOSIS — F339 Major depressive disorder, recurrent, unspecified: Secondary | ICD-10-CM | POA: Diagnosis not present

## 2022-11-22 DIAGNOSIS — L299 Pruritus, unspecified: Secondary | ICD-10-CM | POA: Diagnosis not present

## 2022-11-22 DIAGNOSIS — Z0000 Encounter for general adult medical examination without abnormal findings: Secondary | ICD-10-CM | POA: Diagnosis not present

## 2022-11-22 DIAGNOSIS — M79674 Pain in right toe(s): Secondary | ICD-10-CM | POA: Diagnosis not present

## 2022-11-22 DIAGNOSIS — Z23 Encounter for immunization: Secondary | ICD-10-CM | POA: Diagnosis not present

## 2023-01-05 DIAGNOSIS — J019 Acute sinusitis, unspecified: Secondary | ICD-10-CM | POA: Diagnosis not present

## 2023-01-05 DIAGNOSIS — Z03818 Encounter for observation for suspected exposure to other biological agents ruled out: Secondary | ICD-10-CM | POA: Diagnosis not present

## 2023-01-05 DIAGNOSIS — B9789 Other viral agents as the cause of diseases classified elsewhere: Secondary | ICD-10-CM | POA: Diagnosis not present

## 2023-01-06 DIAGNOSIS — Z03818 Encounter for observation for suspected exposure to other biological agents ruled out: Secondary | ICD-10-CM | POA: Diagnosis not present

## 2023-01-06 DIAGNOSIS — J189 Pneumonia, unspecified organism: Secondary | ICD-10-CM | POA: Diagnosis not present

## 2023-01-06 DIAGNOSIS — R051 Acute cough: Secondary | ICD-10-CM | POA: Diagnosis not present

## 2023-01-21 DIAGNOSIS — R21 Rash and other nonspecific skin eruption: Secondary | ICD-10-CM | POA: Diagnosis not present

## 2023-01-21 DIAGNOSIS — J301 Allergic rhinitis due to pollen: Secondary | ICD-10-CM | POA: Diagnosis not present

## 2023-01-21 DIAGNOSIS — J3081 Allergic rhinitis due to animal (cat) (dog) hair and dander: Secondary | ICD-10-CM | POA: Diagnosis not present

## 2023-01-21 DIAGNOSIS — J3089 Other allergic rhinitis: Secondary | ICD-10-CM | POA: Diagnosis not present

## 2023-01-29 DIAGNOSIS — J301 Allergic rhinitis due to pollen: Secondary | ICD-10-CM | POA: Diagnosis not present

## 2023-01-30 DIAGNOSIS — J301 Allergic rhinitis due to pollen: Secondary | ICD-10-CM | POA: Diagnosis not present

## 2023-01-30 DIAGNOSIS — J3081 Allergic rhinitis due to animal (cat) (dog) hair and dander: Secondary | ICD-10-CM | POA: Diagnosis not present

## 2023-02-11 DIAGNOSIS — E039 Hypothyroidism, unspecified: Secondary | ICD-10-CM | POA: Diagnosis not present

## 2023-02-13 DIAGNOSIS — Z79899 Other long term (current) drug therapy: Secondary | ICD-10-CM | POA: Diagnosis not present

## 2023-02-13 DIAGNOSIS — H209 Unspecified iridocyclitis: Secondary | ICD-10-CM | POA: Diagnosis not present

## 2023-02-13 DIAGNOSIS — M459 Ankylosing spondylitis of unspecified sites in spine: Secondary | ICD-10-CM | POA: Diagnosis not present

## 2023-02-17 DIAGNOSIS — J3081 Allergic rhinitis due to animal (cat) (dog) hair and dander: Secondary | ICD-10-CM | POA: Diagnosis not present

## 2023-02-17 DIAGNOSIS — J301 Allergic rhinitis due to pollen: Secondary | ICD-10-CM | POA: Diagnosis not present

## 2023-02-17 DIAGNOSIS — R21 Rash and other nonspecific skin eruption: Secondary | ICD-10-CM | POA: Diagnosis not present

## 2023-02-17 DIAGNOSIS — J3089 Other allergic rhinitis: Secondary | ICD-10-CM | POA: Diagnosis not present

## 2023-02-19 DIAGNOSIS — J301 Allergic rhinitis due to pollen: Secondary | ICD-10-CM | POA: Diagnosis not present

## 2023-02-19 DIAGNOSIS — J3081 Allergic rhinitis due to animal (cat) (dog) hair and dander: Secondary | ICD-10-CM | POA: Diagnosis not present

## 2023-02-19 DIAGNOSIS — R21 Rash and other nonspecific skin eruption: Secondary | ICD-10-CM | POA: Diagnosis not present

## 2023-02-19 DIAGNOSIS — J3089 Other allergic rhinitis: Secondary | ICD-10-CM | POA: Diagnosis not present

## 2023-02-21 DIAGNOSIS — J3089 Other allergic rhinitis: Secondary | ICD-10-CM | POA: Diagnosis not present

## 2023-02-21 DIAGNOSIS — J3081 Allergic rhinitis due to animal (cat) (dog) hair and dander: Secondary | ICD-10-CM | POA: Diagnosis not present

## 2023-02-21 DIAGNOSIS — R21 Rash and other nonspecific skin eruption: Secondary | ICD-10-CM | POA: Diagnosis not present

## 2023-02-21 DIAGNOSIS — J301 Allergic rhinitis due to pollen: Secondary | ICD-10-CM | POA: Diagnosis not present

## 2023-02-24 DIAGNOSIS — J301 Allergic rhinitis due to pollen: Secondary | ICD-10-CM | POA: Diagnosis not present

## 2023-02-24 DIAGNOSIS — J3081 Allergic rhinitis due to animal (cat) (dog) hair and dander: Secondary | ICD-10-CM | POA: Diagnosis not present

## 2023-02-24 DIAGNOSIS — J3089 Other allergic rhinitis: Secondary | ICD-10-CM | POA: Diagnosis not present

## 2023-03-03 DIAGNOSIS — J3081 Allergic rhinitis due to animal (cat) (dog) hair and dander: Secondary | ICD-10-CM | POA: Diagnosis not present

## 2023-03-03 DIAGNOSIS — J3089 Other allergic rhinitis: Secondary | ICD-10-CM | POA: Diagnosis not present

## 2023-03-03 DIAGNOSIS — J301 Allergic rhinitis due to pollen: Secondary | ICD-10-CM | POA: Diagnosis not present

## 2023-03-06 DIAGNOSIS — J301 Allergic rhinitis due to pollen: Secondary | ICD-10-CM | POA: Diagnosis not present

## 2023-03-06 DIAGNOSIS — J3081 Allergic rhinitis due to animal (cat) (dog) hair and dander: Secondary | ICD-10-CM | POA: Diagnosis not present

## 2023-03-06 DIAGNOSIS — J3089 Other allergic rhinitis: Secondary | ICD-10-CM | POA: Diagnosis not present

## 2023-03-10 DIAGNOSIS — J301 Allergic rhinitis due to pollen: Secondary | ICD-10-CM | POA: Diagnosis not present

## 2023-03-10 DIAGNOSIS — J3081 Allergic rhinitis due to animal (cat) (dog) hair and dander: Secondary | ICD-10-CM | POA: Diagnosis not present

## 2023-03-10 DIAGNOSIS — J3089 Other allergic rhinitis: Secondary | ICD-10-CM | POA: Diagnosis not present

## 2023-03-13 DIAGNOSIS — J301 Allergic rhinitis due to pollen: Secondary | ICD-10-CM | POA: Diagnosis not present

## 2023-03-13 DIAGNOSIS — J3081 Allergic rhinitis due to animal (cat) (dog) hair and dander: Secondary | ICD-10-CM | POA: Diagnosis not present

## 2023-03-13 DIAGNOSIS — J3089 Other allergic rhinitis: Secondary | ICD-10-CM | POA: Diagnosis not present

## 2023-03-17 DIAGNOSIS — J3081 Allergic rhinitis due to animal (cat) (dog) hair and dander: Secondary | ICD-10-CM | POA: Diagnosis not present

## 2023-03-17 DIAGNOSIS — J3089 Other allergic rhinitis: Secondary | ICD-10-CM | POA: Diagnosis not present

## 2023-03-17 DIAGNOSIS — J301 Allergic rhinitis due to pollen: Secondary | ICD-10-CM | POA: Diagnosis not present

## 2023-03-24 DIAGNOSIS — L258 Unspecified contact dermatitis due to other agents: Secondary | ICD-10-CM | POA: Diagnosis not present

## 2023-04-17 DIAGNOSIS — Z6828 Body mass index (BMI) 28.0-28.9, adult: Secondary | ICD-10-CM | POA: Diagnosis not present

## 2023-04-17 DIAGNOSIS — Z800 Family history of malignant neoplasm of digestive organs: Secondary | ICD-10-CM | POA: Diagnosis not present

## 2023-04-17 DIAGNOSIS — Z808 Family history of malignant neoplasm of other organs or systems: Secondary | ICD-10-CM | POA: Diagnosis not present

## 2023-04-17 DIAGNOSIS — Z8041 Family history of malignant neoplasm of ovary: Secondary | ICD-10-CM | POA: Diagnosis not present

## 2023-04-17 DIAGNOSIS — Z01419 Encounter for gynecological examination (general) (routine) without abnormal findings: Secondary | ICD-10-CM | POA: Diagnosis not present

## 2023-04-17 DIAGNOSIS — Z124 Encounter for screening for malignant neoplasm of cervix: Secondary | ICD-10-CM | POA: Diagnosis not present

## 2023-04-17 DIAGNOSIS — Z1231 Encounter for screening mammogram for malignant neoplasm of breast: Secondary | ICD-10-CM | POA: Diagnosis not present

## 2023-04-22 DIAGNOSIS — J3081 Allergic rhinitis due to animal (cat) (dog) hair and dander: Secondary | ICD-10-CM | POA: Diagnosis not present

## 2023-04-22 DIAGNOSIS — J301 Allergic rhinitis due to pollen: Secondary | ICD-10-CM | POA: Diagnosis not present

## 2023-04-22 DIAGNOSIS — J3089 Other allergic rhinitis: Secondary | ICD-10-CM | POA: Diagnosis not present

## 2023-04-25 DIAGNOSIS — J301 Allergic rhinitis due to pollen: Secondary | ICD-10-CM | POA: Diagnosis not present

## 2023-04-25 DIAGNOSIS — J3089 Other allergic rhinitis: Secondary | ICD-10-CM | POA: Diagnosis not present

## 2023-04-25 DIAGNOSIS — J3081 Allergic rhinitis due to animal (cat) (dog) hair and dander: Secondary | ICD-10-CM | POA: Diagnosis not present

## 2023-04-28 DIAGNOSIS — J301 Allergic rhinitis due to pollen: Secondary | ICD-10-CM | POA: Diagnosis not present

## 2023-04-28 DIAGNOSIS — J3081 Allergic rhinitis due to animal (cat) (dog) hair and dander: Secondary | ICD-10-CM | POA: Diagnosis not present

## 2023-04-28 DIAGNOSIS — J3089 Other allergic rhinitis: Secondary | ICD-10-CM | POA: Diagnosis not present

## 2023-05-01 DIAGNOSIS — J3089 Other allergic rhinitis: Secondary | ICD-10-CM | POA: Diagnosis not present

## 2023-05-01 DIAGNOSIS — J3081 Allergic rhinitis due to animal (cat) (dog) hair and dander: Secondary | ICD-10-CM | POA: Diagnosis not present

## 2023-05-01 DIAGNOSIS — J301 Allergic rhinitis due to pollen: Secondary | ICD-10-CM | POA: Diagnosis not present

## 2023-05-05 DIAGNOSIS — J3081 Allergic rhinitis due to animal (cat) (dog) hair and dander: Secondary | ICD-10-CM | POA: Diagnosis not present

## 2023-05-05 DIAGNOSIS — J301 Allergic rhinitis due to pollen: Secondary | ICD-10-CM | POA: Diagnosis not present

## 2023-05-05 DIAGNOSIS — J3089 Other allergic rhinitis: Secondary | ICD-10-CM | POA: Diagnosis not present

## 2023-05-08 DIAGNOSIS — J301 Allergic rhinitis due to pollen: Secondary | ICD-10-CM | POA: Diagnosis not present

## 2023-05-08 DIAGNOSIS — J3089 Other allergic rhinitis: Secondary | ICD-10-CM | POA: Diagnosis not present

## 2023-05-08 DIAGNOSIS — J3081 Allergic rhinitis due to animal (cat) (dog) hair and dander: Secondary | ICD-10-CM | POA: Diagnosis not present

## 2023-05-12 DIAGNOSIS — J301 Allergic rhinitis due to pollen: Secondary | ICD-10-CM | POA: Diagnosis not present

## 2023-05-12 DIAGNOSIS — J3081 Allergic rhinitis due to animal (cat) (dog) hair and dander: Secondary | ICD-10-CM | POA: Diagnosis not present

## 2023-05-12 DIAGNOSIS — J3089 Other allergic rhinitis: Secondary | ICD-10-CM | POA: Diagnosis not present

## 2023-05-15 DIAGNOSIS — J301 Allergic rhinitis due to pollen: Secondary | ICD-10-CM | POA: Diagnosis not present

## 2023-05-15 DIAGNOSIS — J3089 Other allergic rhinitis: Secondary | ICD-10-CM | POA: Diagnosis not present

## 2023-05-15 DIAGNOSIS — J3081 Allergic rhinitis due to animal (cat) (dog) hair and dander: Secondary | ICD-10-CM | POA: Diagnosis not present

## 2023-05-19 DIAGNOSIS — J3081 Allergic rhinitis due to animal (cat) (dog) hair and dander: Secondary | ICD-10-CM | POA: Diagnosis not present

## 2023-05-19 DIAGNOSIS — J301 Allergic rhinitis due to pollen: Secondary | ICD-10-CM | POA: Diagnosis not present

## 2023-05-19 DIAGNOSIS — J3089 Other allergic rhinitis: Secondary | ICD-10-CM | POA: Diagnosis not present

## 2023-05-22 DIAGNOSIS — J3081 Allergic rhinitis due to animal (cat) (dog) hair and dander: Secondary | ICD-10-CM | POA: Diagnosis not present

## 2023-05-22 DIAGNOSIS — J3089 Other allergic rhinitis: Secondary | ICD-10-CM | POA: Diagnosis not present

## 2023-05-22 DIAGNOSIS — J301 Allergic rhinitis due to pollen: Secondary | ICD-10-CM | POA: Diagnosis not present

## 2023-05-26 DIAGNOSIS — J301 Allergic rhinitis due to pollen: Secondary | ICD-10-CM | POA: Diagnosis not present

## 2023-05-26 DIAGNOSIS — J3081 Allergic rhinitis due to animal (cat) (dog) hair and dander: Secondary | ICD-10-CM | POA: Diagnosis not present

## 2023-05-26 DIAGNOSIS — J3089 Other allergic rhinitis: Secondary | ICD-10-CM | POA: Diagnosis not present

## 2023-05-29 DIAGNOSIS — Z8041 Family history of malignant neoplasm of ovary: Secondary | ICD-10-CM | POA: Diagnosis not present

## 2023-05-30 DIAGNOSIS — J3081 Allergic rhinitis due to animal (cat) (dog) hair and dander: Secondary | ICD-10-CM | POA: Diagnosis not present

## 2023-05-30 DIAGNOSIS — J3089 Other allergic rhinitis: Secondary | ICD-10-CM | POA: Diagnosis not present

## 2023-05-30 DIAGNOSIS — J301 Allergic rhinitis due to pollen: Secondary | ICD-10-CM | POA: Diagnosis not present

## 2023-06-06 DIAGNOSIS — J301 Allergic rhinitis due to pollen: Secondary | ICD-10-CM | POA: Diagnosis not present

## 2023-06-06 DIAGNOSIS — J3089 Other allergic rhinitis: Secondary | ICD-10-CM | POA: Diagnosis not present

## 2023-06-06 DIAGNOSIS — J3081 Allergic rhinitis due to animal (cat) (dog) hair and dander: Secondary | ICD-10-CM | POA: Diagnosis not present

## 2023-06-09 DIAGNOSIS — J3081 Allergic rhinitis due to animal (cat) (dog) hair and dander: Secondary | ICD-10-CM | POA: Diagnosis not present

## 2023-06-09 DIAGNOSIS — J3089 Other allergic rhinitis: Secondary | ICD-10-CM | POA: Diagnosis not present

## 2023-06-09 DIAGNOSIS — J301 Allergic rhinitis due to pollen: Secondary | ICD-10-CM | POA: Diagnosis not present

## 2023-06-10 DIAGNOSIS — Z1283 Encounter for screening for malignant neoplasm of skin: Secondary | ICD-10-CM | POA: Diagnosis not present

## 2023-06-10 DIAGNOSIS — L218 Other seborrheic dermatitis: Secondary | ICD-10-CM | POA: Diagnosis not present

## 2023-06-10 DIAGNOSIS — D225 Melanocytic nevi of trunk: Secondary | ICD-10-CM | POA: Diagnosis not present

## 2023-06-12 DIAGNOSIS — J3081 Allergic rhinitis due to animal (cat) (dog) hair and dander: Secondary | ICD-10-CM | POA: Diagnosis not present

## 2023-06-12 DIAGNOSIS — J3089 Other allergic rhinitis: Secondary | ICD-10-CM | POA: Diagnosis not present

## 2023-06-12 DIAGNOSIS — J301 Allergic rhinitis due to pollen: Secondary | ICD-10-CM | POA: Diagnosis not present

## 2023-06-16 DIAGNOSIS — J3089 Other allergic rhinitis: Secondary | ICD-10-CM | POA: Diagnosis not present

## 2023-06-16 DIAGNOSIS — J3081 Allergic rhinitis due to animal (cat) (dog) hair and dander: Secondary | ICD-10-CM | POA: Diagnosis not present

## 2023-06-16 DIAGNOSIS — J301 Allergic rhinitis due to pollen: Secondary | ICD-10-CM | POA: Diagnosis not present

## 2023-06-19 DIAGNOSIS — J3081 Allergic rhinitis due to animal (cat) (dog) hair and dander: Secondary | ICD-10-CM | POA: Diagnosis not present

## 2023-06-19 DIAGNOSIS — J301 Allergic rhinitis due to pollen: Secondary | ICD-10-CM | POA: Diagnosis not present

## 2023-06-19 DIAGNOSIS — J3089 Other allergic rhinitis: Secondary | ICD-10-CM | POA: Diagnosis not present

## 2023-06-23 DIAGNOSIS — J301 Allergic rhinitis due to pollen: Secondary | ICD-10-CM | POA: Diagnosis not present

## 2023-06-23 DIAGNOSIS — J3081 Allergic rhinitis due to animal (cat) (dog) hair and dander: Secondary | ICD-10-CM | POA: Diagnosis not present

## 2023-06-23 DIAGNOSIS — J3089 Other allergic rhinitis: Secondary | ICD-10-CM | POA: Diagnosis not present

## 2023-06-25 DIAGNOSIS — J3089 Other allergic rhinitis: Secondary | ICD-10-CM | POA: Diagnosis not present

## 2023-06-25 DIAGNOSIS — J301 Allergic rhinitis due to pollen: Secondary | ICD-10-CM | POA: Diagnosis not present

## 2023-06-25 DIAGNOSIS — J3081 Allergic rhinitis due to animal (cat) (dog) hair and dander: Secondary | ICD-10-CM | POA: Diagnosis not present

## 2023-06-30 DIAGNOSIS — J3089 Other allergic rhinitis: Secondary | ICD-10-CM | POA: Diagnosis not present

## 2023-06-30 DIAGNOSIS — J3081 Allergic rhinitis due to animal (cat) (dog) hair and dander: Secondary | ICD-10-CM | POA: Diagnosis not present

## 2023-06-30 DIAGNOSIS — J301 Allergic rhinitis due to pollen: Secondary | ICD-10-CM | POA: Diagnosis not present

## 2023-07-02 DIAGNOSIS — J3081 Allergic rhinitis due to animal (cat) (dog) hair and dander: Secondary | ICD-10-CM | POA: Diagnosis not present

## 2023-07-02 DIAGNOSIS — J301 Allergic rhinitis due to pollen: Secondary | ICD-10-CM | POA: Diagnosis not present

## 2023-07-02 DIAGNOSIS — J3089 Other allergic rhinitis: Secondary | ICD-10-CM | POA: Diagnosis not present

## 2023-07-11 DIAGNOSIS — N200 Calculus of kidney: Secondary | ICD-10-CM | POA: Diagnosis not present

## 2023-07-14 DIAGNOSIS — J301 Allergic rhinitis due to pollen: Secondary | ICD-10-CM | POA: Diagnosis not present

## 2023-07-14 DIAGNOSIS — J3081 Allergic rhinitis due to animal (cat) (dog) hair and dander: Secondary | ICD-10-CM | POA: Diagnosis not present

## 2023-07-14 DIAGNOSIS — J3089 Other allergic rhinitis: Secondary | ICD-10-CM | POA: Diagnosis not present

## 2023-07-15 DIAGNOSIS — M542 Cervicalgia: Secondary | ICD-10-CM | POA: Diagnosis not present

## 2023-07-15 DIAGNOSIS — H01139 Eczematous dermatitis of unspecified eye, unspecified eyelid: Secondary | ICD-10-CM | POA: Diagnosis not present

## 2023-07-26 DIAGNOSIS — B974 Respiratory syncytial virus as the cause of diseases classified elsewhere: Secondary | ICD-10-CM | POA: Diagnosis not present

## 2023-07-26 DIAGNOSIS — Z03818 Encounter for observation for suspected exposure to other biological agents ruled out: Secondary | ICD-10-CM | POA: Diagnosis not present

## 2023-07-26 DIAGNOSIS — J069 Acute upper respiratory infection, unspecified: Secondary | ICD-10-CM | POA: Diagnosis not present

## 2023-07-26 DIAGNOSIS — R051 Acute cough: Secondary | ICD-10-CM | POA: Diagnosis not present

## 2023-07-30 DIAGNOSIS — J210 Acute bronchiolitis due to respiratory syncytial virus: Secondary | ICD-10-CM | POA: Diagnosis not present

## 2023-07-30 DIAGNOSIS — R051 Acute cough: Secondary | ICD-10-CM | POA: Diagnosis not present

## 2023-07-30 DIAGNOSIS — R062 Wheezing: Secondary | ICD-10-CM | POA: Diagnosis not present

## 2023-08-06 DIAGNOSIS — Z79899 Other long term (current) drug therapy: Secondary | ICD-10-CM | POA: Diagnosis not present

## 2023-08-06 DIAGNOSIS — R5383 Other fatigue: Secondary | ICD-10-CM | POA: Diagnosis not present

## 2023-08-06 DIAGNOSIS — H209 Unspecified iridocyclitis: Secondary | ICD-10-CM | POA: Diagnosis not present

## 2023-08-06 DIAGNOSIS — M459 Ankylosing spondylitis of unspecified sites in spine: Secondary | ICD-10-CM | POA: Diagnosis not present

## 2023-08-29 DIAGNOSIS — J3081 Allergic rhinitis due to animal (cat) (dog) hair and dander: Secondary | ICD-10-CM | POA: Diagnosis not present

## 2023-08-29 DIAGNOSIS — J45998 Other asthma: Secondary | ICD-10-CM | POA: Diagnosis not present

## 2023-08-29 DIAGNOSIS — J3089 Other allergic rhinitis: Secondary | ICD-10-CM | POA: Diagnosis not present

## 2023-08-29 DIAGNOSIS — J301 Allergic rhinitis due to pollen: Secondary | ICD-10-CM | POA: Diagnosis not present

## 2023-08-29 DIAGNOSIS — R21 Rash and other nonspecific skin eruption: Secondary | ICD-10-CM | POA: Diagnosis not present

## 2023-08-29 DIAGNOSIS — R052 Subacute cough: Secondary | ICD-10-CM | POA: Diagnosis not present

## 2023-09-03 DIAGNOSIS — Z79899 Other long term (current) drug therapy: Secondary | ICD-10-CM | POA: Diagnosis not present

## 2023-09-03 DIAGNOSIS — M542 Cervicalgia: Secondary | ICD-10-CM | POA: Diagnosis not present

## 2023-09-03 DIAGNOSIS — M459 Ankylosing spondylitis of unspecified sites in spine: Secondary | ICD-10-CM | POA: Diagnosis not present

## 2023-09-03 DIAGNOSIS — H209 Unspecified iridocyclitis: Secondary | ICD-10-CM | POA: Diagnosis not present

## 2023-09-04 DIAGNOSIS — J301 Allergic rhinitis due to pollen: Secondary | ICD-10-CM | POA: Diagnosis not present

## 2023-09-04 DIAGNOSIS — J3089 Other allergic rhinitis: Secondary | ICD-10-CM | POA: Diagnosis not present

## 2023-09-04 DIAGNOSIS — J3081 Allergic rhinitis due to animal (cat) (dog) hair and dander: Secondary | ICD-10-CM | POA: Diagnosis not present

## 2023-09-08 DIAGNOSIS — M5030 Other cervical disc degeneration, unspecified cervical region: Secondary | ICD-10-CM | POA: Diagnosis not present

## 2023-09-11 DIAGNOSIS — J3089 Other allergic rhinitis: Secondary | ICD-10-CM | POA: Diagnosis not present

## 2023-09-11 DIAGNOSIS — J3081 Allergic rhinitis due to animal (cat) (dog) hair and dander: Secondary | ICD-10-CM | POA: Diagnosis not present

## 2023-09-11 DIAGNOSIS — J301 Allergic rhinitis due to pollen: Secondary | ICD-10-CM | POA: Diagnosis not present

## 2023-09-19 DIAGNOSIS — J3081 Allergic rhinitis due to animal (cat) (dog) hair and dander: Secondary | ICD-10-CM | POA: Diagnosis not present

## 2023-09-19 DIAGNOSIS — J3089 Other allergic rhinitis: Secondary | ICD-10-CM | POA: Diagnosis not present

## 2023-09-19 DIAGNOSIS — M542 Cervicalgia: Secondary | ICD-10-CM | POA: Diagnosis not present

## 2023-09-19 DIAGNOSIS — J301 Allergic rhinitis due to pollen: Secondary | ICD-10-CM | POA: Diagnosis not present

## 2023-09-25 DIAGNOSIS — J3089 Other allergic rhinitis: Secondary | ICD-10-CM | POA: Diagnosis not present

## 2023-09-25 DIAGNOSIS — J301 Allergic rhinitis due to pollen: Secondary | ICD-10-CM | POA: Diagnosis not present

## 2023-09-25 DIAGNOSIS — J3081 Allergic rhinitis due to animal (cat) (dog) hair and dander: Secondary | ICD-10-CM | POA: Diagnosis not present

## 2023-09-26 DIAGNOSIS — M542 Cervicalgia: Secondary | ICD-10-CM | POA: Diagnosis not present

## 2023-10-01 DIAGNOSIS — J3089 Other allergic rhinitis: Secondary | ICD-10-CM | POA: Diagnosis not present

## 2023-10-01 DIAGNOSIS — M542 Cervicalgia: Secondary | ICD-10-CM | POA: Diagnosis not present

## 2023-10-01 DIAGNOSIS — J301 Allergic rhinitis due to pollen: Secondary | ICD-10-CM | POA: Diagnosis not present

## 2023-10-01 DIAGNOSIS — J3081 Allergic rhinitis due to animal (cat) (dog) hair and dander: Secondary | ICD-10-CM | POA: Diagnosis not present

## 2023-10-06 DIAGNOSIS — M542 Cervicalgia: Secondary | ICD-10-CM | POA: Diagnosis not present

## 2023-10-08 DIAGNOSIS — M542 Cervicalgia: Secondary | ICD-10-CM | POA: Diagnosis not present

## 2023-10-10 DIAGNOSIS — J3089 Other allergic rhinitis: Secondary | ICD-10-CM | POA: Diagnosis not present

## 2023-10-10 DIAGNOSIS — J3081 Allergic rhinitis due to animal (cat) (dog) hair and dander: Secondary | ICD-10-CM | POA: Diagnosis not present

## 2023-10-10 DIAGNOSIS — J301 Allergic rhinitis due to pollen: Secondary | ICD-10-CM | POA: Diagnosis not present

## 2023-10-13 DIAGNOSIS — M542 Cervicalgia: Secondary | ICD-10-CM | POA: Diagnosis not present

## 2023-10-16 DIAGNOSIS — J301 Allergic rhinitis due to pollen: Secondary | ICD-10-CM | POA: Diagnosis not present

## 2023-10-16 DIAGNOSIS — J3081 Allergic rhinitis due to animal (cat) (dog) hair and dander: Secondary | ICD-10-CM | POA: Diagnosis not present

## 2023-10-16 DIAGNOSIS — J3089 Other allergic rhinitis: Secondary | ICD-10-CM | POA: Diagnosis not present

## 2023-10-17 DIAGNOSIS — M542 Cervicalgia: Secondary | ICD-10-CM | POA: Diagnosis not present

## 2023-10-27 DIAGNOSIS — M542 Cervicalgia: Secondary | ICD-10-CM | POA: Diagnosis not present

## 2023-10-29 DIAGNOSIS — M542 Cervicalgia: Secondary | ICD-10-CM | POA: Diagnosis not present

## 2023-10-30 DIAGNOSIS — H2513 Age-related nuclear cataract, bilateral: Secondary | ICD-10-CM | POA: Diagnosis not present

## 2023-10-30 DIAGNOSIS — H43812 Vitreous degeneration, left eye: Secondary | ICD-10-CM | POA: Diagnosis not present

## 2023-10-30 DIAGNOSIS — J3081 Allergic rhinitis due to animal (cat) (dog) hair and dander: Secondary | ICD-10-CM | POA: Diagnosis not present

## 2023-10-30 DIAGNOSIS — J301 Allergic rhinitis due to pollen: Secondary | ICD-10-CM | POA: Diagnosis not present

## 2023-10-30 DIAGNOSIS — H35363 Drusen (degenerative) of macula, bilateral: Secondary | ICD-10-CM | POA: Diagnosis not present

## 2023-10-30 DIAGNOSIS — J3089 Other allergic rhinitis: Secondary | ICD-10-CM | POA: Diagnosis not present

## 2023-10-30 DIAGNOSIS — H1045 Other chronic allergic conjunctivitis: Secondary | ICD-10-CM | POA: Diagnosis not present

## 2023-11-05 DIAGNOSIS — M542 Cervicalgia: Secondary | ICD-10-CM | POA: Diagnosis not present

## 2023-11-06 DIAGNOSIS — J3089 Other allergic rhinitis: Secondary | ICD-10-CM | POA: Diagnosis not present

## 2023-11-06 DIAGNOSIS — J301 Allergic rhinitis due to pollen: Secondary | ICD-10-CM | POA: Diagnosis not present

## 2023-11-06 DIAGNOSIS — J3081 Allergic rhinitis due to animal (cat) (dog) hair and dander: Secondary | ICD-10-CM | POA: Diagnosis not present

## 2023-11-08 DIAGNOSIS — M542 Cervicalgia: Secondary | ICD-10-CM | POA: Diagnosis not present

## 2023-11-13 DIAGNOSIS — J3081 Allergic rhinitis due to animal (cat) (dog) hair and dander: Secondary | ICD-10-CM | POA: Diagnosis not present

## 2023-11-13 DIAGNOSIS — J301 Allergic rhinitis due to pollen: Secondary | ICD-10-CM | POA: Diagnosis not present

## 2023-11-13 DIAGNOSIS — J3089 Other allergic rhinitis: Secondary | ICD-10-CM | POA: Diagnosis not present

## 2023-11-20 DIAGNOSIS — J3089 Other allergic rhinitis: Secondary | ICD-10-CM | POA: Diagnosis not present

## 2023-11-20 DIAGNOSIS — J301 Allergic rhinitis due to pollen: Secondary | ICD-10-CM | POA: Diagnosis not present

## 2023-11-20 DIAGNOSIS — J3081 Allergic rhinitis due to animal (cat) (dog) hair and dander: Secondary | ICD-10-CM | POA: Diagnosis not present

## 2023-11-21 DIAGNOSIS — Z0000 Encounter for general adult medical examination without abnormal findings: Secondary | ICD-10-CM | POA: Diagnosis not present

## 2023-11-21 DIAGNOSIS — E559 Vitamin D deficiency, unspecified: Secondary | ICD-10-CM | POA: Diagnosis not present

## 2023-11-21 DIAGNOSIS — E039 Hypothyroidism, unspecified: Secondary | ICD-10-CM | POA: Diagnosis not present

## 2023-11-27 DIAGNOSIS — Z0000 Encounter for general adult medical examination without abnormal findings: Secondary | ICD-10-CM | POA: Diagnosis not present

## 2023-11-27 DIAGNOSIS — F339 Major depressive disorder, recurrent, unspecified: Secondary | ICD-10-CM | POA: Diagnosis not present

## 2023-11-27 DIAGNOSIS — Z23 Encounter for immunization: Secondary | ICD-10-CM | POA: Diagnosis not present

## 2023-11-27 DIAGNOSIS — E663 Overweight: Secondary | ICD-10-CM | POA: Diagnosis not present

## 2023-11-27 DIAGNOSIS — E039 Hypothyroidism, unspecified: Secondary | ICD-10-CM | POA: Diagnosis not present

## 2023-11-28 DIAGNOSIS — J3081 Allergic rhinitis due to animal (cat) (dog) hair and dander: Secondary | ICD-10-CM | POA: Diagnosis not present

## 2023-11-28 DIAGNOSIS — J301 Allergic rhinitis due to pollen: Secondary | ICD-10-CM | POA: Diagnosis not present

## 2023-11-28 DIAGNOSIS — J3089 Other allergic rhinitis: Secondary | ICD-10-CM | POA: Diagnosis not present

## 2023-11-29 DIAGNOSIS — M542 Cervicalgia: Secondary | ICD-10-CM | POA: Diagnosis not present

## 2023-12-03 DIAGNOSIS — M459 Ankylosing spondylitis of unspecified sites in spine: Secondary | ICD-10-CM | POA: Diagnosis not present

## 2023-12-03 DIAGNOSIS — Z79899 Other long term (current) drug therapy: Secondary | ICD-10-CM | POA: Diagnosis not present

## 2023-12-03 DIAGNOSIS — H209 Unspecified iridocyclitis: Secondary | ICD-10-CM | POA: Diagnosis not present

## 2023-12-03 DIAGNOSIS — M542 Cervicalgia: Secondary | ICD-10-CM | POA: Diagnosis not present

## 2023-12-11 DIAGNOSIS — J3081 Allergic rhinitis due to animal (cat) (dog) hair and dander: Secondary | ICD-10-CM | POA: Diagnosis not present

## 2023-12-11 DIAGNOSIS — J3089 Other allergic rhinitis: Secondary | ICD-10-CM | POA: Diagnosis not present

## 2023-12-11 DIAGNOSIS — J301 Allergic rhinitis due to pollen: Secondary | ICD-10-CM | POA: Diagnosis not present

## 2023-12-19 DIAGNOSIS — J301 Allergic rhinitis due to pollen: Secondary | ICD-10-CM | POA: Diagnosis not present

## 2023-12-19 DIAGNOSIS — J3081 Allergic rhinitis due to animal (cat) (dog) hair and dander: Secondary | ICD-10-CM | POA: Diagnosis not present

## 2023-12-19 DIAGNOSIS — J3089 Other allergic rhinitis: Secondary | ICD-10-CM | POA: Diagnosis not present

## 2023-12-25 DIAGNOSIS — J3089 Other allergic rhinitis: Secondary | ICD-10-CM | POA: Diagnosis not present

## 2023-12-25 DIAGNOSIS — J3081 Allergic rhinitis due to animal (cat) (dog) hair and dander: Secondary | ICD-10-CM | POA: Diagnosis not present

## 2023-12-25 DIAGNOSIS — J301 Allergic rhinitis due to pollen: Secondary | ICD-10-CM | POA: Diagnosis not present

## 2024-01-01 DIAGNOSIS — J3081 Allergic rhinitis due to animal (cat) (dog) hair and dander: Secondary | ICD-10-CM | POA: Diagnosis not present

## 2024-01-01 DIAGNOSIS — J3089 Other allergic rhinitis: Secondary | ICD-10-CM | POA: Diagnosis not present

## 2024-01-01 DIAGNOSIS — J301 Allergic rhinitis due to pollen: Secondary | ICD-10-CM | POA: Diagnosis not present

## 2024-01-08 DIAGNOSIS — J301 Allergic rhinitis due to pollen: Secondary | ICD-10-CM | POA: Diagnosis not present

## 2024-01-08 DIAGNOSIS — J3081 Allergic rhinitis due to animal (cat) (dog) hair and dander: Secondary | ICD-10-CM | POA: Diagnosis not present

## 2024-01-08 DIAGNOSIS — J3089 Other allergic rhinitis: Secondary | ICD-10-CM | POA: Diagnosis not present

## 2024-01-09 DIAGNOSIS — M1711 Unilateral primary osteoarthritis, right knee: Secondary | ICD-10-CM | POA: Diagnosis not present

## 2024-01-16 DIAGNOSIS — M1711 Unilateral primary osteoarthritis, right knee: Secondary | ICD-10-CM | POA: Diagnosis not present

## 2024-01-22 DIAGNOSIS — J3081 Allergic rhinitis due to animal (cat) (dog) hair and dander: Secondary | ICD-10-CM | POA: Diagnosis not present

## 2024-01-22 DIAGNOSIS — J3089 Other allergic rhinitis: Secondary | ICD-10-CM | POA: Diagnosis not present

## 2024-01-22 DIAGNOSIS — J301 Allergic rhinitis due to pollen: Secondary | ICD-10-CM | POA: Diagnosis not present

## 2024-01-23 DIAGNOSIS — J3081 Allergic rhinitis due to animal (cat) (dog) hair and dander: Secondary | ICD-10-CM | POA: Diagnosis not present

## 2024-01-23 DIAGNOSIS — J3089 Other allergic rhinitis: Secondary | ICD-10-CM | POA: Diagnosis not present

## 2024-01-28 DIAGNOSIS — J3081 Allergic rhinitis due to animal (cat) (dog) hair and dander: Secondary | ICD-10-CM | POA: Diagnosis not present

## 2024-01-28 DIAGNOSIS — R21 Rash and other nonspecific skin eruption: Secondary | ICD-10-CM | POA: Diagnosis not present

## 2024-01-28 DIAGNOSIS — J3089 Other allergic rhinitis: Secondary | ICD-10-CM | POA: Diagnosis not present

## 2024-01-28 DIAGNOSIS — J4520 Mild intermittent asthma, uncomplicated: Secondary | ICD-10-CM | POA: Diagnosis not present

## 2024-01-28 DIAGNOSIS — J301 Allergic rhinitis due to pollen: Secondary | ICD-10-CM | POA: Diagnosis not present

## 2024-02-02 DIAGNOSIS — M25561 Pain in right knee: Secondary | ICD-10-CM | POA: Diagnosis not present

## 2024-02-02 DIAGNOSIS — M1711 Unilateral primary osteoarthritis, right knee: Secondary | ICD-10-CM | POA: Diagnosis not present

## 2024-02-04 DIAGNOSIS — J3089 Other allergic rhinitis: Secondary | ICD-10-CM | POA: Diagnosis not present

## 2024-02-04 DIAGNOSIS — J3081 Allergic rhinitis due to animal (cat) (dog) hair and dander: Secondary | ICD-10-CM | POA: Diagnosis not present

## 2024-02-04 DIAGNOSIS — J301 Allergic rhinitis due to pollen: Secondary | ICD-10-CM | POA: Diagnosis not present

## 2024-02-09 DIAGNOSIS — R310 Gross hematuria: Secondary | ICD-10-CM | POA: Diagnosis not present

## 2024-02-09 DIAGNOSIS — N200 Calculus of kidney: Secondary | ICD-10-CM | POA: Diagnosis not present

## 2024-02-10 DIAGNOSIS — E039 Hypothyroidism, unspecified: Secondary | ICD-10-CM | POA: Diagnosis not present

## 2024-02-11 DIAGNOSIS — N200 Calculus of kidney: Secondary | ICD-10-CM | POA: Diagnosis not present

## 2024-02-11 DIAGNOSIS — J3081 Allergic rhinitis due to animal (cat) (dog) hair and dander: Secondary | ICD-10-CM | POA: Diagnosis not present

## 2024-02-11 DIAGNOSIS — J3089 Other allergic rhinitis: Secondary | ICD-10-CM | POA: Diagnosis not present

## 2024-02-11 DIAGNOSIS — R310 Gross hematuria: Secondary | ICD-10-CM | POA: Diagnosis not present

## 2024-02-11 DIAGNOSIS — K449 Diaphragmatic hernia without obstruction or gangrene: Secondary | ICD-10-CM | POA: Diagnosis not present

## 2024-02-11 DIAGNOSIS — J301 Allergic rhinitis due to pollen: Secondary | ICD-10-CM | POA: Diagnosis not present

## 2024-02-17 DIAGNOSIS — K5730 Diverticulosis of large intestine without perforation or abscess without bleeding: Secondary | ICD-10-CM | POA: Diagnosis not present

## 2024-02-17 DIAGNOSIS — K648 Other hemorrhoids: Secondary | ICD-10-CM | POA: Diagnosis not present

## 2024-02-17 DIAGNOSIS — Z1211 Encounter for screening for malignant neoplasm of colon: Secondary | ICD-10-CM | POA: Diagnosis not present

## 2024-02-17 DIAGNOSIS — Z800 Family history of malignant neoplasm of digestive organs: Secondary | ICD-10-CM | POA: Diagnosis not present

## 2024-02-18 DIAGNOSIS — J3089 Other allergic rhinitis: Secondary | ICD-10-CM | POA: Diagnosis not present

## 2024-02-18 DIAGNOSIS — J3081 Allergic rhinitis due to animal (cat) (dog) hair and dander: Secondary | ICD-10-CM | POA: Diagnosis not present

## 2024-02-18 DIAGNOSIS — J301 Allergic rhinitis due to pollen: Secondary | ICD-10-CM | POA: Diagnosis not present

## 2024-02-19 DIAGNOSIS — R310 Gross hematuria: Secondary | ICD-10-CM | POA: Diagnosis not present

## 2024-02-19 DIAGNOSIS — N201 Calculus of ureter: Secondary | ICD-10-CM | POA: Diagnosis not present

## 2024-02-20 DIAGNOSIS — M1711 Unilateral primary osteoarthritis, right knee: Secondary | ICD-10-CM | POA: Diagnosis not present

## 2024-02-20 DIAGNOSIS — M25561 Pain in right knee: Secondary | ICD-10-CM | POA: Diagnosis not present

## 2024-02-25 ENCOUNTER — Other Ambulatory Visit: Payer: Self-pay | Admitting: Urology

## 2024-02-25 DIAGNOSIS — J3081 Allergic rhinitis due to animal (cat) (dog) hair and dander: Secondary | ICD-10-CM | POA: Diagnosis not present

## 2024-02-25 DIAGNOSIS — J301 Allergic rhinitis due to pollen: Secondary | ICD-10-CM | POA: Diagnosis not present

## 2024-02-25 DIAGNOSIS — J3089 Other allergic rhinitis: Secondary | ICD-10-CM | POA: Diagnosis not present

## 2024-02-27 ENCOUNTER — Encounter (HOSPITAL_COMMUNITY): Payer: Self-pay | Admitting: Urology

## 2024-02-27 NOTE — Progress Notes (Signed)
 [facility] PREOP PHONE CALL   ALLERGIES REVIEWED: YES  MEDICATION REVIEW DONE: YES MEDICATIONS THAT PT SHOULD HOLD (LIST): Voltaren/NSAIDS hold 48hr  CAN PT WALK UP STAIRS WITHOUT SHORTNESS OF BREATH: YES HOME O2: NO CPAP: NO  IF YES, INFORMED PT TO BRING CPAP WITH TUBING AND MASK:YES/NO   INFORMED DRIVER NEEDED FOR PROCEDURE: YES   PT WAS GIVEN BLUE FOLDER AT UROLOGY APPT: YES PT INFORMED TO BRING BLUE FOLDER WITH ALL CONTENTS: YES  REVIEWED ARRIVAL TIME AND LOCATION: YES  OTHER PERTINENT INFORMATION:

## 2024-03-01 ENCOUNTER — Other Ambulatory Visit: Payer: Self-pay

## 2024-03-01 ENCOUNTER — Ambulatory Visit (HOSPITAL_COMMUNITY)
Admission: RE | Admit: 2024-03-01 | Discharge: 2024-03-01 | Disposition: A | Discharge status: Home / Self Care | Attending: Urology | Admitting: Urology

## 2024-03-01 ENCOUNTER — Encounter (HOSPITAL_COMMUNITY)
Admission: RE | Disposition: A | Discharge status: Home / Self Care | Payer: Self-pay | Source: Home / Self Care | Attending: Urology

## 2024-03-01 ENCOUNTER — Ambulatory Visit (HOSPITAL_COMMUNITY)

## 2024-03-01 DIAGNOSIS — N202 Calculus of kidney with calculus of ureter: Secondary | ICD-10-CM | POA: Diagnosis not present

## 2024-03-01 DIAGNOSIS — N201 Calculus of ureter: Secondary | ICD-10-CM | POA: Diagnosis not present

## 2024-03-01 HISTORY — PX: EXTRACORPOREAL SHOCK WAVE LITHOTRIPSY: SHX1557

## 2024-03-01 SURGERY — LITHOTRIPSY, ESWL
Anesthesia: LOCAL | Laterality: Right

## 2024-03-01 MED ORDER — DIPHENHYDRAMINE HCL 25 MG PO CAPS
25.0000 mg | ORAL_CAPSULE | ORAL | Status: AC
  Administered 2024-03-01: 25 mg via ORAL
  Filled 2024-03-01: qty 1

## 2024-03-01 MED ORDER — HYDROCODONE-ACETAMINOPHEN 5-325 MG PO TABS: 1.0000 | ORAL_TABLET | Freq: Four times a day (QID) | ORAL | 0 refills | Status: DC | PRN

## 2024-03-01 MED ORDER — CIPROFLOXACIN HCL 500 MG PO TABS
500.0000 mg | ORAL_TABLET | ORAL | Status: AC
  Administered 2024-03-01: 500 mg via ORAL
  Filled 2024-03-01: qty 1

## 2024-03-01 MED ORDER — BISACODYL 5 MG PO TBEC: 5.0000 mg | DELAYED_RELEASE_TABLET | Freq: Every day | ORAL | Status: DC | PRN

## 2024-03-01 MED ORDER — SODIUM CHLORIDE 0.9 % IV SOLN: INTRAVENOUS | Status: DC

## 2024-03-01 MED ORDER — DIAZEPAM 5 MG PO TABS
10.0000 mg | ORAL_TABLET | ORAL | Status: AC
  Administered 2024-03-01: 10 mg via ORAL
  Filled 2024-03-01: qty 2

## 2024-03-01 NOTE — H&P (Signed)
 See scanned H&P

## 2024-03-01 NOTE — Op Note (Signed)
 See [facility] OP note scanned into chart. Also because of the size, density, location and other factors that cannot be anticipated I feel this will likely be a staged procedure. This fact supersedes any indication in the scanned [place] [facility] operative note to the contrary.

## 2024-03-02 ENCOUNTER — Encounter (HOSPITAL_BASED_OUTPATIENT_CLINIC_OR_DEPARTMENT_OTHER): Payer: Self-pay

## 2024-03-02 ENCOUNTER — Emergency Department (HOSPITAL_BASED_OUTPATIENT_CLINIC_OR_DEPARTMENT_OTHER)

## 2024-03-02 ENCOUNTER — Emergency Department (HOSPITAL_BASED_OUTPATIENT_CLINIC_OR_DEPARTMENT_OTHER)
Admission: EM | Admit: 2024-03-02 | Discharge: 2024-03-02 | Disposition: A | Attending: Emergency Medicine | Admitting: Emergency Medicine

## 2024-03-02 ENCOUNTER — Other Ambulatory Visit: Payer: Self-pay

## 2024-03-02 DIAGNOSIS — E039 Hypothyroidism, unspecified: Secondary | ICD-10-CM | POA: Insufficient documentation

## 2024-03-02 DIAGNOSIS — N201 Calculus of ureter: Secondary | ICD-10-CM

## 2024-03-02 DIAGNOSIS — Z79899 Other long term (current) drug therapy: Secondary | ICD-10-CM | POA: Insufficient documentation

## 2024-03-02 DIAGNOSIS — N132 Hydronephrosis with renal and ureteral calculous obstruction: Secondary | ICD-10-CM | POA: Diagnosis not present

## 2024-03-02 DIAGNOSIS — Z9049 Acquired absence of other specified parts of digestive tract: Secondary | ICD-10-CM | POA: Diagnosis not present

## 2024-03-02 DIAGNOSIS — R10A1 Flank pain, right side: Secondary | ICD-10-CM | POA: Diagnosis not present

## 2024-03-02 DIAGNOSIS — N3001 Acute cystitis with hematuria: Secondary | ICD-10-CM | POA: Insufficient documentation

## 2024-03-02 DIAGNOSIS — R109 Unspecified abdominal pain: Secondary | ICD-10-CM | POA: Diagnosis not present

## 2024-03-02 LAB — BASIC METABOLIC PANEL WITH GFR
Anion gap: 11 (ref 5–15)
BUN: 20 mg/dL (ref 8–23)
CO2: 27 mmol/L (ref 22–32)
Calcium: 9.6 mg/dL (ref 8.9–10.3)
Chloride: 103 mmol/L (ref 98–111)
Creatinine, Ser: 0.83 mg/dL (ref 0.44–1.00)
GFR, Estimated: >60 mL/min (ref >60)
Glucose, Bld: 136 mg/dL — ABNORMAL HIGH (ref 70–99)
Potassium: 3.8 mmol/L (ref 3.5–5.1)
Sodium: 140 mmol/L (ref 135–145)

## 2024-03-02 LAB — URINALYSIS, ROUTINE W REFLEX MICROSCOPIC
Bilirubin Urine: NEGATIVE
Glucose, UA: NEGATIVE mg/dL
Ketones, ur: NEGATIVE mg/dL
Leukocytes,Ua: NEGATIVE
Nitrite: NEGATIVE
Protein, ur: 30 mg/dL — AB
RBC / HPF: >50 RBC/hpf (ref 0–5)
Specific Gravity, Urine: 1.024 (ref 1.005–1.030)
pH: 5.5 (ref 5.0–8.0)

## 2024-03-02 LAB — CBC WITH DIFFERENTIAL/PLATELET
Abs Immature Granulocytes: 0.02 K/uL (ref 0.00–0.07)
Basophils Absolute: 0 K/uL (ref 0.0–0.1)
Basophils Relative: 0 %
Eosinophils Absolute: 0.2 K/uL (ref 0.0–0.5)
Eosinophils Relative: 2 %
HCT: 40.9 % (ref 36.0–46.0)
Hemoglobin: 13.8 g/dL (ref 12.0–15.0)
Immature Granulocytes: 0 %
Lymphocytes Relative: 21 %
Lymphs Abs: 1.8 K/uL (ref 0.7–4.0)
MCH: 29.5 pg (ref 26.0–34.0)
MCHC: 33.7 g/dL (ref 30.0–36.0)
MCV: 87.4 fL (ref 80.0–100.0)
Monocytes Absolute: 0.6 K/uL (ref 0.1–1.0)
Monocytes Relative: 7 %
Neutro Abs: 6.1 K/uL (ref 1.7–7.7)
Neutrophils Relative %: 70 %
Platelets: 286 K/uL (ref 150–400)
RBC: 4.68 MIL/uL (ref 3.87–5.11)
RDW: 12.8 % (ref 11.5–15.5)
WBC: 8.8 K/uL (ref 4.0–10.5)
nRBC: 0 % (ref 0.0–0.2)

## 2024-03-02 MED ORDER — KETOROLAC TROMETHAMINE 15 MG/ML IJ SOLN
15.0000 mg | Freq: Once | INTRAMUSCULAR | Status: AC
  Administered 2024-03-02: 15 mg via INTRAVENOUS
  Filled 2024-03-02: qty 1

## 2024-03-02 MED ORDER — CEPHALEXIN 500 MG PO CAPS: 500.0000 mg | ORAL_CAPSULE | Freq: Two times a day (BID) | ORAL | 0 refills | Status: AC

## 2024-03-02 MED ORDER — ONDANSETRON 4 MG PO TBDP: 4.0000 mg | ORAL_TABLET | Freq: Three times a day (TID) | ORAL | 0 refills | Status: AC | PRN

## 2024-03-02 MED ORDER — MORPHINE SULFATE 15 MG PO TABS: 15.0000 mg | ORAL_TABLET | ORAL | 0 refills | Status: AC | PRN

## 2024-03-02 MED ORDER — HYDROMORPHONE HCL 1 MG/ML IJ SOLN
1.0000 mg | Freq: Once | INTRAMUSCULAR | Status: AC
  Administered 2024-03-02: 1 mg via INTRAVENOUS
  Filled 2024-03-02: qty 1

## 2024-03-02 MED ORDER — TAMSULOSIN HCL 0.4 MG PO CAPS: 0.4000 mg | ORAL_CAPSULE | Freq: Every day | ORAL | 0 refills | Status: AC

## 2024-03-02 MED ORDER — KETOROLAC TROMETHAMINE 10 MG PO TABS: 10.0000 mg | ORAL_TABLET | Freq: Four times a day (QID) | ORAL | 0 refills | Status: AC | PRN

## 2024-03-02 MED ORDER — FENTANYL CITRATE (PF) 50 MCG/ML IJ SOSY
50.0000 ug | PREFILLED_SYRINGE | Freq: Once | INTRAMUSCULAR | Status: AC
  Administered 2024-03-02: 50 ug via INTRAVENOUS
  Filled 2024-03-02: qty 1

## 2024-03-02 MED ORDER — ONDANSETRON HCL 4 MG/2ML IJ SOLN
4.0000 mg | Freq: Once | INTRAMUSCULAR | Status: AC
  Administered 2024-03-02: 4 mg via INTRAVENOUS
  Filled 2024-03-02: qty 2

## 2024-03-02 MED ORDER — TAMSULOSIN HCL 0.4 MG PO CAPS
0.4000 mg | ORAL_CAPSULE | Freq: Every day | ORAL | Status: DC
  Administered 2024-03-02: 0.4 mg via ORAL
  Filled 2024-03-02: qty 1

## 2024-03-02 NOTE — ED Triage Notes (Signed)
 Pt had lithotripsy done today at [facility] and has been having severe pain in the R flank with nausea, no fevers

## 2024-03-02 NOTE — ED Notes (Signed)
 Patient transported to CT

## 2024-03-02 NOTE — ED Provider Notes (Signed)
 [facility] AT [facility] Provider Note   CSN: [id number] Arrival date & time: 03/02/24  0004     Patient presents with: Post-op Problem   Michelle Fletcher is a 63 y.o. female.   HPI   63 year old female with medical history significant for depression, endometriosis, hypothyroidism, recent nephrolithiasis with lithotripsy done today at [facility] now presenting to the emergency department with severe pain in the right side of the flank with nausea.  The patient initially had only had blood in her urine.  She underwent lithotripsy earlier today (yesterday 12/1) and has subsequently now developed severe pain that is refractory to her home oxycodone .  She denies any dysuria or frequency, continues to endorse hematuria.  She denies any abdominal pain.  She is moving her bowels and passing gas.  She denies any recent falls or trauma.  Prior to Admission medications   Medication Sig Start Date End Date Taking? Authorizing Provider  cephALEXin (KEFLEX) 500 MG capsule Take 1 capsule (500 mg total) by mouth 2 (two) times daily for 5 days. 03/02/24 03/07/24 Yes Jerrol Agent, MD  ketorolac  (TORADOL ) 10 MG tablet Take 1 tablet (10 mg total) by mouth every 6 (six) hours as needed. 03/02/24  Yes Jerrol Agent, MD  morphine  (MSIR) 15 MG tablet Take 1 tablet (15 mg total) by mouth every 4 (four) hours as needed for severe pain (pain score 7-10). 03/02/24  Yes Jerrol Agent, MD  ondansetron  (ZOFRAN -ODT) 4 MG disintegrating tablet Take 1 tablet (4 mg total) by mouth every 8 (eight) hours as needed. 03/02/24  Yes Jerrol Agent, MD  tamsulosin (FLOMAX) 0.4 MG CAPS capsule Take 1 capsule (0.4 mg total) by mouth daily for 5 days. 03/02/24 03/07/24 Yes Jerrol Agent, MD  alfuzosin  (UROXATRAL ) 10 MG 24 hr tablet Take 1 tablet (10 mg total) by mouth daily with breakfast. Patient not taking: Reported on 02/27/2024 04/26/22   Pace, Maryellen D, MD  buPROPion (WELLBUTRIN SR) 150  MG 12 hr tablet Take 150 mg by mouth 2 (two) times daily.     [provider]  Calcium Carb-Cholecalciferol (OS-CAL PO) Take 1 tablet by mouth every morning.    [provider]  cetirizine (ZYRTEC) 10 MG chewable tablet Chew 10 mg by mouth daily.    [provider]  Cholecalciferol (VITAMIN D3) 1.25 MG (50000 UT) TABS Take 1 tablet by mouth at bedtime.    [provider]  diclofenac (VOLTAREN) 75 MG EC tablet Take 75 mg by mouth 2 (two) times daily.    [provider]  levothyroxine (SYNTHROID) 100 MCG tablet Take 100 mcg by mouth daily before breakfast.    [provider]  Probiotic Product (PROBIOTIC-10) CAPS Take 1 capsule by mouth every morning.    [provider]  vitamin C (ASCORBIC ACID) 500 MG tablet Take 500 mg by mouth at bedtime.    [provider]    Allergies: Sulfa antibiotics    Review of Systems  All other systems reviewed and are negative.   Updated Vital Signs BP 127/71   Pulse 72   Temp 97.7 F (36.5 C)   Resp 16   SpO2 99%   Physical Exam Vitals and nursing note reviewed.  Constitutional:      General: She is in acute distress.     Appearance: She is well-developed.     Comments: Clutching her side, in acute pain  HENT:     Head: Normocephalic and atraumatic.  Eyes:  Conjunctiva/sclera: Conjunctivae normal.  Cardiovascular:     Rate and Rhythm: Normal rate and regular rhythm.     Heart sounds: No murmur heard. Pulmonary:     Effort: Pulmonary effort is normal. No respiratory distress.     Breath sounds: Normal breath sounds.  Abdominal:     Palpations: Abdomen is soft.     Tenderness: There is no abdominal tenderness. There is right CVA tenderness.  Musculoskeletal:        General: No swelling.     Cervical back: Neck supple.  Skin:    General: Skin is warm and dry.     Capillary Refill: Capillary refill takes less than 2 seconds.  Neurological:     Mental Status: She is  alert.  Psychiatric:        Mood and Affect: Mood normal.     (all labs ordered are listed, but only abnormal results are displayed) Labs Reviewed  BASIC METABOLIC PANEL WITH GFR - Abnormal; Notable for the following components:      Result Value   Glucose, Bld 136 (*)    All other components within normal limits  URINALYSIS, ROUTINE W REFLEX MICROSCOPIC - Abnormal; Notable for the following components:   APPearance HAZY (*)    Hgb urine dipstick LARGE (*)    Protein, ur 30 (*)    Bacteria, UA RARE (*)    All other components within normal limits  CBC WITH DIFFERENTIAL/PLATELET    EKG: None  Radiology: CT Renal Stone Study Result Date: 03/02/2024 EXAM: CT UROGRAM 03/02/2024 12:45:38 AM TECHNIQUE: CT of the abdomen and pelvis was performed without the administration of intravenous contrast as per CT urogram protocol. Multiplanar reformatted images as well as MIP urogram images are provided for review. Automated exposure control, iterative reconstruction, and/or weight based adjustment of the mA/kV was utilized to reduce the radiation dose to as low as reasonably achievable. COMPARISON: 02/11/2024 CLINICAL HISTORY: Abdominal/flank pain, stone suspected. FINDINGS: LOWER CHEST: No acute abnormality. LIVER: The liver is unremarkable. GALLBLADDER AND BILE DUCTS: Status post cholecystectomy. No biliary ductal dilatation. SPLEEN: No acute abnormality. PANCREAS: No acute abnormality. ADRENAL GLANDS: No acute abnormality. KIDNEYS, URETERS AND BLADDER: Moderate right hydroureteronephrosis with fragmented stones in the proximal right ureter measuring up to 4 mm. Additionally, there are bilateral nonobstructing renal calculi measuring up to 3 mm. Urinary bladder is unremarkable. GI AND BOWEL: Stomach demonstrates no acute abnormality. There is no bowel obstruction. PERITONEUM AND RETROPERITONEUM: No ascites. No free air. VASCULATURE: Aorta is normal in caliber. LYMPH NODES: No lymphadenopathy.  REPRODUCTIVE ORGANS: No acute abnormality. BONES AND SOFT TISSUES: No acute osseous abnormality. No focal soft tissue abnormality. IMPRESSION: 1. Moderate right hydroureteronephrosis due to fragmented proximal right ureteral stones measuring up to 4 mm. 2. Bilateral nonobstructing renal calculi measuring up to 3 mm. Electronically signed by: Franky Stanford MD 03/02/2024 01:08 AM EST RP Workstation: [id number]   DG Abd 1 View Result Date: 03/01/2024 CLINICAL DATA:  Right ureteral stone. EXAM: ABDOMEN - 1 VIEW COMPARISON:  Radiograph 02/19/2024, CT 02/11/2024 FINDINGS: Calcification at the right L2 level corresponds to proximal ureteral calculus on CT. Known bilateral intrarenal calculi, some of which are visualized by radiograph. Normal bowel gas pattern. Cholecystectomy clips in the right upper quadrant. No acute osseous findings. IMPRESSION: 1. Calcification at the right L2 level corresponds to proximal ureteral calculus on CT. 2. Known bilateral intrarenal calculi, some of which are visualized by radiograph. Electronically Signed   By: Andrea Gasman M.D.   On:  03/01/2024 11:08     Procedures   Medications Ordered in the ED  tamsulosin  (FLOMAX ) capsule 0.4 mg (0.4 mg Oral Given 03/02/24 0154)  fentaNYL  (SUBLIMAZE ) injection 50 mcg (50 mcg Intravenous Given 03/02/24 0032)  ondansetron  (ZOFRAN ) injection 4 mg (4 mg Intravenous Given 03/02/24 0036)  HYDROmorphone  (DILAUDID ) injection 1 mg (1 mg Intravenous Given 03/02/24 0057)  ketorolac  (TORADOL ) 15 MG/ML injection 15 mg (15 mg Intravenous Given 03/02/24 0154)    Clinical Course as of 03/02/24 0636  Tue Mar 02, 2024  0634 Bacteria, UA(!): RARE [JL]  0634 WBC, UA: 11-20 [JL]    Clinical Course User Index [JL] Jerrol Agent, MD                                 Medical Decision Making Amount and/or Complexity of Data Reviewed Labs: ordered. Decision-making details documented in ED Course. Radiology: ordered.  Risk Prescription drug  management.    63 year old female with medical history significant for depression, endometriosis, hypothyroidism, recent nephrolithiasis with lithotripsy done today at [facility] now presenting to the emergency department with severe pain in the right side of the flank with nausea.  The patient initially had only had blood in her urine.  She underwent lithotripsy earlier today (yesterday 12/1) and has subsequently now developed severe pain that is refractory to her home oxycodone .  She denies any dysuria or frequency, continues to endorse hematuria.  She denies any abdominal pain.  She is moving her bowels and passing gas.  She denies any recent falls or trauma.  On arrival, the patient was vitally stable, afebrile, mildly bradycardic heart rate 57, BP 149/76, RR 16, saturating 96% on room air.  Patient with right-sided CVA tenderness on exam, no abdominal tenderness.  Patient is status post lithotripsy not having severe pain.  Suspect likely fragments of stone causing hydronephrosis.  Will obtain CT imaging and laboratory evaluation to further assess.  IV access was obtained and the patient was administered IV fentanyl  in addition to IV Zofran .  Required additional pain medication and subsequently administered IV Dilaudid  1 mg.  Labs: CBC without a leukocytosis or anemia, BMP without evidence of an AKI, creatinine 0.83, no electrolyte abnormality.  Urinalysis pending.  CT Stone: IMPRESSION:  1. Moderate right hydroureteronephrosis due to fragmented proximal right  ureteral stones measuring up to 4 mm.  2. Bilateral nonobstructing renal calculi measuring up to 3 mm.   Urology consult: Spoke with Dr. Norva of on-call urology who recommended continued pain management, can follow-up closely in clinic, advised p.o. Toradol  and Flomax .  Patient reassessed at bedside, pain is completely resolved, she is tolerating p.o. intake without vomiting.  She requests switch from Percocet which has given  her headache to oral morphine  which she has tolerated in the past and this was done.  Will prescribe also Zofran  for nausea, oral Toradol , Flomax .  Urinalysis showed evidence of UTI with 11-20 WBCs and rare bacteria present.  Will discharge on Keflex .  Patient overall feeling symptomatically improved, tolerating p.o., stable for discharge and outpatient follow-up with urology.      Final diagnoses:  Ureterolithiasis  Hydronephrosis with urinary obstruction due to renal calculus  Acute cystitis with hematuria    ED Discharge Orders          Ordered    ondansetron  (ZOFRAN -ODT) 4 MG disintegrating tablet  Every 8 hours PRN        03/02/24 0314  tamsulosin (FLOMAX) 0.4 MG CAPS capsule  Daily        03/02/24 0314    ketorolac  (TORADOL ) 10 MG tablet  Every 6 hours PRN        03/02/24 0314    morphine  (MSIR) 15 MG tablet  Every 4 hours PRN        03/02/24 0556    cephALEXin (KEFLEX) 500 MG capsule  2 times daily        03/02/24 9365               Jerrol Agent, MD 03/02/24 [phone]

## 2024-03-02 NOTE — Discharge Instructions (Addendum)
 Your CT scan showed stone fragments which are likely causing your pain.  Your urine did show evidence of infection and we will discharge you on an antibiotic.  Follow-up outpatient with urology, return for any severe worsening symptoms.  IMPRESSION:  1. Moderate right hydroureteronephrosis due to fragmented proximal right  ureteral stones measuring up to 4 mm.  2. Bilateral nonobstructing renal calculi measuring up to 3 mm.

## 2024-03-03 DIAGNOSIS — J3089 Other allergic rhinitis: Secondary | ICD-10-CM | POA: Diagnosis not present

## 2024-03-03 DIAGNOSIS — J301 Allergic rhinitis due to pollen: Secondary | ICD-10-CM | POA: Diagnosis not present

## 2024-03-03 DIAGNOSIS — J3081 Allergic rhinitis due to animal (cat) (dog) hair and dander: Secondary | ICD-10-CM | POA: Diagnosis not present

## 2024-03-08 DIAGNOSIS — M1711 Unilateral primary osteoarthritis, right knee: Secondary | ICD-10-CM | POA: Diagnosis not present

## 2024-03-08 DIAGNOSIS — M25561 Pain in right knee: Secondary | ICD-10-CM | POA: Diagnosis not present

## 2024-03-10 DIAGNOSIS — J301 Allergic rhinitis due to pollen: Secondary | ICD-10-CM | POA: Diagnosis not present

## 2024-03-10 DIAGNOSIS — J3089 Other allergic rhinitis: Secondary | ICD-10-CM | POA: Diagnosis not present

## 2024-03-10 DIAGNOSIS — J3081 Allergic rhinitis due to animal (cat) (dog) hair and dander: Secondary | ICD-10-CM | POA: Diagnosis not present

## 2024-03-15 DIAGNOSIS — N201 Calculus of ureter: Secondary | ICD-10-CM | POA: Diagnosis not present

## 2024-03-17 DIAGNOSIS — J3081 Allergic rhinitis due to animal (cat) (dog) hair and dander: Secondary | ICD-10-CM | POA: Diagnosis not present

## 2024-03-17 DIAGNOSIS — J301 Allergic rhinitis due to pollen: Secondary | ICD-10-CM | POA: Diagnosis not present

## 2024-03-17 DIAGNOSIS — J3089 Other allergic rhinitis: Secondary | ICD-10-CM | POA: Diagnosis not present

## 2024-03-23 DIAGNOSIS — J3089 Other allergic rhinitis: Secondary | ICD-10-CM | POA: Diagnosis not present

## 2024-03-23 DIAGNOSIS — J3081 Allergic rhinitis due to animal (cat) (dog) hair and dander: Secondary | ICD-10-CM | POA: Diagnosis not present

## 2024-03-23 DIAGNOSIS — J301 Allergic rhinitis due to pollen: Secondary | ICD-10-CM | POA: Diagnosis not present
